# Patient Record
Sex: Female | Born: 1985 | Race: White | Hispanic: No | Marital: Single | Smoking: Current every day smoker
Health system: Southern US, Community
[De-identification: ages and names within clinical notes are randomized; demographics above are authoritative.]

## PROBLEM LIST (undated history)

## (undated) DIAGNOSIS — N390 Urinary tract infection, site not specified: Secondary | ICD-10-CM

## (undated) DIAGNOSIS — R51 Headache: Secondary | ICD-10-CM

## (undated) DIAGNOSIS — N879 Dysplasia of cervix uteri, unspecified: Secondary | ICD-10-CM

## (undated) DIAGNOSIS — F4310 Post-traumatic stress disorder, unspecified: Secondary | ICD-10-CM

## (undated) DIAGNOSIS — R002 Palpitations: Secondary | ICD-10-CM

## (undated) DIAGNOSIS — T50901A Poisoning by unspecified drugs, medicaments and biological substances, accidental (unintentional), initial encounter: Secondary | ICD-10-CM

## (undated) DIAGNOSIS — R519 Headache, unspecified: Secondary | ICD-10-CM

## (undated) DIAGNOSIS — I10 Essential (primary) hypertension: Secondary | ICD-10-CM

## (undated) DIAGNOSIS — J40 Bronchitis, not specified as acute or chronic: Secondary | ICD-10-CM

## (undated) DIAGNOSIS — M419 Scoliosis, unspecified: Secondary | ICD-10-CM

## (undated) DIAGNOSIS — K219 Gastro-esophageal reflux disease without esophagitis: Secondary | ICD-10-CM

## (undated) DIAGNOSIS — F419 Anxiety disorder, unspecified: Secondary | ICD-10-CM

## (undated) DIAGNOSIS — F410 Panic disorder [episodic paroxysmal anxiety] without agoraphobia: Secondary | ICD-10-CM

## (undated) DIAGNOSIS — F1910 Other psychoactive substance abuse, uncomplicated: Secondary | ICD-10-CM

## (undated) DIAGNOSIS — J449 Chronic obstructive pulmonary disease, unspecified: Secondary | ICD-10-CM

## (undated) DIAGNOSIS — A749 Chlamydial infection, unspecified: Secondary | ICD-10-CM

## (undated) DIAGNOSIS — M1990 Unspecified osteoarthritis, unspecified site: Secondary | ICD-10-CM

## (undated) DIAGNOSIS — R87629 Unspecified abnormal cytological findings in specimens from vagina: Secondary | ICD-10-CM

## (undated) HISTORY — DX: Scoliosis, unspecified: M41.9

## (undated) HISTORY — PX: TUBAL LIGATION: SHX77

## (undated) HISTORY — DX: Essential (primary) hypertension: I10

## (undated) HISTORY — DX: Gastro-esophageal reflux disease without esophagitis: K21.9

## (undated) HISTORY — DX: Dysplasia of cervix uteri, unspecified: N87.9

## (undated) HISTORY — PX: MULTIPLE TOOTH EXTRACTIONS: SHX2053

## (undated) HISTORY — PX: COLPOSCOPY: SHX161

## (undated) HISTORY — DX: Anxiety disorder, unspecified: F41.9

## (undated) HISTORY — DX: Chronic obstructive pulmonary disease, unspecified: J44.9

## (undated) HISTORY — DX: Chlamydial infection, unspecified: A74.9

## (undated) HISTORY — DX: Post-traumatic stress disorder, unspecified: F43.10

## (undated) HISTORY — DX: Unspecified abnormal cytological findings in specimens from vagina: R87.629

---
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
## 2007-03-18 DIAGNOSIS — A749 Chlamydial infection, unspecified: Secondary | ICD-10-CM

## 2007-03-18 HISTORY — DX: Chlamydial infection, unspecified: A74.9

## 2008-08-11 ENCOUNTER — Emergency Department: Payer: Self-pay | Admitting: Unknown Physician Specialty

## 2008-08-11 ENCOUNTER — Emergency Department: Payer: Self-pay | Admitting: Emergency Medicine

## 2009-03-22 ENCOUNTER — Emergency Department (HOSPITAL_COMMUNITY): Admission: EM | Admit: 2009-03-22 | Discharge: 2009-03-22 | Payer: Self-pay | Admitting: Emergency Medicine

## 2009-03-24 ENCOUNTER — Emergency Department (HOSPITAL_COMMUNITY): Admission: EM | Admit: 2009-03-24 | Discharge: 2009-03-24 | Payer: Self-pay | Admitting: Emergency Medicine

## 2009-05-11 ENCOUNTER — Emergency Department (HOSPITAL_COMMUNITY): Admission: EM | Admit: 2009-05-11 | Discharge: 2009-05-12 | Payer: Self-pay | Admitting: Emergency Medicine

## 2009-06-27 ENCOUNTER — Emergency Department (HOSPITAL_COMMUNITY): Admission: EM | Admit: 2009-06-27 | Discharge: 2009-06-27 | Payer: Self-pay | Admitting: Emergency Medicine

## 2009-07-16 ENCOUNTER — Emergency Department (HOSPITAL_COMMUNITY): Admission: EM | Admit: 2009-07-16 | Discharge: 2009-07-16 | Payer: Self-pay | Admitting: Emergency Medicine

## 2010-03-17 DIAGNOSIS — F419 Anxiety disorder, unspecified: Secondary | ICD-10-CM

## 2010-03-17 HISTORY — DX: Anxiety disorder, unspecified: F41.9

## 2010-06-02 LAB — CULTURE, BLOOD (ROUTINE X 2)

## 2010-06-02 LAB — WOUND CULTURE

## 2010-06-02 LAB — DIFFERENTIAL
Basophils Relative: 1 % (ref 0–1)
Eosinophils Relative: 1 % (ref 0–5)
Monocytes Absolute: 0.6 10*3/uL (ref 0.1–1.0)
Neutro Abs: 8.2 10*3/uL — ABNORMAL HIGH (ref 1.7–7.7)

## 2010-06-02 LAB — BASIC METABOLIC PANEL
CO2: 25 mEq/L (ref 19–32)
Creatinine, Ser: 0.57 mg/dL (ref 0.4–1.2)
GFR calc Af Amer: >60 mL/min (ref >60)
GFR calc non Af Amer: >60 mL/min (ref >60)

## 2010-06-02 LAB — CBC
Hemoglobin: 11.8 g/dL — ABNORMAL LOW (ref 12.0–15.0)
MCHC: 34.3 g/dL (ref 30.0–36.0)
MCV: 87.1 fL (ref 78.0–100.0)
Platelets: 307 10*3/uL (ref 150–400)
WBC: 10.2 10*3/uL (ref 4.0–10.5)

## 2010-06-04 LAB — DIFFERENTIAL
Basophils Absolute: 0 10*3/uL (ref 0.0–0.1)
Basophils Relative: 1 % (ref 0–1)
Eosinophils Absolute: 0.1 10*3/uL (ref 0.0–0.7)
Eosinophils Relative: 1 % (ref 0–5)

## 2010-06-04 LAB — D-DIMER, QUANTITATIVE: D-Dimer, Quant: 2.99 ug/mL-FEU — ABNORMAL HIGH (ref 0.00–0.48)

## 2010-06-04 LAB — COMPREHENSIVE METABOLIC PANEL
AST: 19 U/L (ref 0–37)
Albumin: 2.9 g/dL — ABNORMAL LOW (ref 3.5–5.2)
BUN: 9 mg/dL (ref 6–23)
CO2: 25 mEq/L (ref 19–32)
GFR calc Af Amer: >60 mL/min (ref >60)
GFR calc non Af Amer: >60 mL/min (ref >60)

## 2010-06-04 LAB — CBC
HCT: 30.5 % — ABNORMAL LOW (ref 36.0–46.0)
Hemoglobin: 11 g/dL — ABNORMAL LOW (ref 12.0–15.0)
MCHC: 36.1 g/dL — ABNORMAL HIGH (ref 30.0–36.0)
MCV: 82.5 fL (ref 78.0–100.0)
Platelets: 219 10*3/uL (ref 150–400)
RBC: 3.69 MIL/uL — ABNORMAL LOW (ref 3.87–5.11)

## 2010-06-06 LAB — DIFFERENTIAL
Lymphs Abs: 1.5 10*3/uL (ref 0.7–4.0)
Neutro Abs: 5.7 10*3/uL (ref 1.7–7.7)

## 2010-06-06 LAB — BASIC METABOLIC PANEL
BUN: 9 mg/dL (ref 6–23)
Calcium: 9.3 mg/dL (ref 8.4–10.5)
Creatinine, Ser: 0.63 mg/dL (ref 0.4–1.2)
GFR calc Af Amer: >60 mL/min (ref >60)
Glucose, Bld: 83 mg/dL (ref 70–99)
Potassium: 3.3 mEq/L — ABNORMAL LOW (ref 3.5–5.1)

## 2010-06-06 LAB — CBC
HCT: 37.4 % (ref 36.0–46.0)
MCHC: 31.5 g/dL (ref 30.0–36.0)
MCV: 83.4 fL (ref 78.0–100.0)
RBC: 4.48 MIL/uL (ref 3.87–5.11)
RDW: 14.9 % (ref 11.5–15.5)

## 2010-08-02 NOTE — Discharge Summary (Signed)
[facility]  Patient:    Rita Baldwin, Rita Baldwin                    MRN: [id number] Adm. Date:  [id number] Disc. Date: [id number] Attending:  Milford Cage H                           Discharge Summary  PATIENT IDENTIFICATION:  Patient was a 25 year old female.  INITIAL ASSESSMENT AND DIAGNOSIS:  Patient was admitted to the hospital after taking approximately 18-20 Excedrin migraine tablets.  She said she had been having problems with her relationship with her "former" boyfriend.  They had broken up recently but they have broken up many times before.  This time, he was dating another girl that neither he nor she liked originally.  She said she was feeling upset, angry and depressed and she had taken the overdose.  By the time of the interview here, she said she realized that she had done wrong. She no longer felt like killing herself.  She had a lot to live for.  She is still depressed and angry over the relationship.  MENTAL STATUS:  At the time of the initial evaluation, revealed an alert, oriented young woman.  She was very straightforward.  She agreed that she did things she was not supposed to do but she was clear about what she did and she was clear about the consequences before she did them and she said she was willing to take her consequences.  She admitted to suicidal thoughts yesterday and the overdose but today she said she felt better.  Insight was minimal. Intellectual functioning seemed at least average.  Concentration was adequate. Judgment currently seemed adequate.  Short and long-term memory were intact. Other pertinent history can be obtained from the psychosocial service summary.  PHYSICAL EXAMINATION:  Noncontributory.  ADMITTING DIAGNOSES: Axis I:    Depressive disorder not otherwise specified. Axis II:   No diagnosis. Axis III:  Healthy. Axis IV:   Moderate. Axis V:    45/75.  FINDINGS:  All indicated laboratory examinations were  within normal limits or noncontributory.  HOSPITAL COURSE:  While in the hospital, patient remained fairly headstrong. She talked openly about her relationship with her boyfriend.  She realized that he was not good for her.  She put up with behaviors that she should not have to put up with.  Nevertheless, she loved him and said she did it for those reasons, otherwise, she was the kind of person who would not normally put up with people being mean or rude to her.  She believed she had finally had enough of her boyfriend but she was also very clear that her life was her choice and the decision she made were hers to make.  People did have to agree or disagree or disagree with them.  She said she learned from her mistakes and she was not asking for any breaks or consequences.  In meeting with her parents, she was equally clear.  To some extent, she was a bit more rude to them but her point was basically the same.  They were willing to be flexible with her but, at the same time, said they were going to enforce their rules as well as they could.  They were concerned that she would be home by herself but they will have somebody checking on her.  Nonetheless, she had consistently denied any threats to kill herself  or anyone else and was subsequently discharged home.  POST-HOSPITAL CARE PLANS:  She is referred to Leeanne Mannan for an appointment on 08/22/99.  DISCHARGE MEDICATIONS:  There were no medications used in the hospital nor were any prescribed at the time of discharge.  FINAL DIAGNOSES: Axis I:    Depressive disorder not otherwise specified. Axis II:   No diagnosis. Axis III:  Healthy. Axis IV:   Moderate. Axis V:    50. DD:  08/25/99 TD:  08/25/99 Job: [id number]

## 2010-08-02 NOTE — Consult Note (Signed)
[facility]  Patient:    Rita Baldwin, Rita Baldwin Visit Number: [id number] MRN: [id number]          Service Type: OUT Location: RAD Attending Physician:  Rita Baldwin Dictated by:   Rita Baldwin, P.A. Admit Date:  06/07/2001 Discharge Date: 06/07/2001   CC:         Rita Baldwin, M.D.   Consultation Report  DATE OF BIRTH:  [birth year]  REFERRING PHYSICIAN:  Kari Baldwin, M.D.  REASON FOR REFERRAL:  Nausea and vomiting.  HISTORY OF PRESENT ILLNESS:  Patient is a pleasant 25 year old Caucasian female who presents today at the request of Dr. Juanetta Baldwin for further evaluation of nausea and vomiting.  She is accompanied by her mother today.  Patient complains of a two month history of intermittent nausea and vomiting which occurs mostly postprandially.  She generally will become nauseated about an hour after she eats and then will proceed with vomiting about two to three hours later.  Symptoms are sporadic and intermittent in nature.  She will go several days in a row with having episodes and then may be okay for a week at a time.  She describes nocturnal symptoms, often up waking up in the middle of the night with nausea and then vomiting.  She complains of typical heartburn symptoms, especially related to certain foods.  She takes Tums three to four days per week for this.  She often has "sour burps."  When she vomits she generally vomits a greenish-yellowish or even brown liquid.  Occasionally, she will vomit up undigested food.  She denies any hoarseness.  She complains of sore throat after vomiting.  She complains of her throat burning.  In addition to postprandial vomiting she also notices episodes of vomiting when she gets very upset.  She has not been on any prescription strength antacids.  She takes Phenergan for her symptoms.  She denies any abdominal pain, constipation, diarrhea, melena, or rectal bleeding.  She takes a  significant amount of ibuprofen, at least taking some from five to seven days per week. She takes this primarily for headaches.  She takes Vioxx occasionally for abdominal pain cramps related to her menses.  She has been on this for at least a couple of years.  She tells me that she has been on birth control pills for three years.  She was switched to Nortrel six to seven months ago; however, had been on this medication several months before the nausea and vomiting began.  She gives a history of intentional overdose on Excedrin when she was 25 years old.  She went through inpatient behavioral medicine program following this episode.  CURRENT MEDICATIONS: 1. Birth control pill q.d. 2. Vioxx p.r.n. 3. Phenergan 25 mg p.r.n. 4. Ibuprofen five to seven days per week.  ALLERGIES:  ACETAMINOPHEN.  PAST MEDICAL HISTORY:  History of overdose on Excedrin age 82 status post inpatient behavioral medicine program.  PAST SURGICAL HISTORY:  Teeth extraction x6.  FAMILY HISTORY:  Mother has hypertension, depression, history of acute hepatitis C which is resolved (at age 49).  Father has history of gastroesophageal reflux disease.  He has a history of acute hepatitis B which resolved at age 42.  Denies any family history of colon cancer.  SOCIAL HISTORY:  She is a Medical sales representative.  She apparently has missed a significant amount of school as she has asked to postpone any further tests until after she finishes this year.  She is a nonsmoker.  She  consumes alcohol one to two times per year.  REVIEW OF SYSTEMS:  Please see HPI for GI.  GENERAL:  Denies any weight loss. GENITOURINARY:  She has a period about three to four times a year which she takes Vioxx for cramps.  She is on birth control pills because of acne. Please see HPI for GI.  PHYSICAL EXAMINATION  VITAL SIGNS:  Weight 121.5, height 5 feet 2.5 inches, temperature 98.6, blood pressure 100/70, pulse 86.  GENERAL:  Very pleasant,  well-nourished, well-developed, young Caucasian female in no acute distress.  She is accompanied by her mother.  SKIN:  Warm and dry.  No jaundice.  HEENT:  Conjunctivae are pink.  Sclerae nonicteric.  Pupils are equal, round, and reactive to light.  Oropharyngeal mucosa moist and pink.  No lesions, erythema, or exudate.  NECK:  No lymphadenopathy, thyromegaly.  CHEST:  Lungs clear to auscultation.  CARDIAC:  Regular rate and rhythm.  Normal S1 and S2.  No murmur, rub, or gallop.  ABDOMEN:  Positive bowel sounds, soft, nondistended.  No organomegaly or masses.  She has mild to moderate tenderness in the right upper quadrant region with deep palpation.  RECTAL:  Deferred at patients request.  EXTREMITIES:  No cyanosis or edema.  LABORATORIES:  Abdominal ultrasound on May 28, 2001 was unremarkable. Gallbladder was normal.  No intrahepatic duct dilatation.  Liver appeared normal.  HIDA scan on June 07, 2001 revealed ejection fraction of 47.7% and did not reproduce the patients nausea and vomiting, although she did have some minimal cramping with the challenge.  IMPRESSION:  Rita Baldwin is a 25 year old Caucasian female with a two month history of intermittent nausea and vomiting.  Symptoms occur mostly postprandially; however, she does note some episodes related to her being upset.  She also has typical acid reflux symptoms which is partially responsive to over-the-counter antacids.  She has no change in her bowel habits which would make her symptoms less likely to be secondary to irritable bowel syndrome.  A functional component of even psychiatric component cannot be ruled out.  However, given history of significant NSAID use, typical acid reflux symptoms, I feel the patient needs to have an EGD in the near future to rule out peptic ulcer disease, changes of reflux esophagitis, or even structural abnormality causing partial outlet obstruction.  Unfortunately,  patient cannot  miss a full day of school because of previous absentees.  She therefore asked to hold off on EGD until the end of May.  I have offered upper GI series to rule out significant ulcer; however, patient and her mother would like to proceed with upper endoscopy only.  Given this we will collect three Hemoccult cards to rule out significant GI bleed and will begin PPI therapy.  We need to also consider inflammatory bowel disease as a possible etiology. At this time she does not appear to have symptoms secondary to gallbladder disease with normal ultrasound and HIDA scan.  PLAN: 1. Hemoccult cards x3. 2. Will begin Nexium 40 mg p.o. q.a.m.  Four weeks of samples given. 3. Information sheet on antireflux measures given and discussed with patient. 4. I have asked the patient to avoid NSAIDs if possible.  She, unfortunately,    is allergic to acetaminophen.  I have asked her to follow up with Dr.    Juanetta Baldwin for possibility of beginning a medication to prevent migraines    instead of patient taking significant NSAIDs to treat migraines.  She will    plan to  make an appointment. 5. EGD in the near future.  Please note patient opted to hold off until the    end of May until when she gets out of school.  I have asked her to let us    know if she develops more persistent nausea, vomiting, abdominal pain,    melena, or rectal bleeding.  I would like to thank Dr. Juanetta Baldwin for allowing [place] to take part in the care of this patient. Dictated by:   Rita Baldwin, P.A. Attending Physician:  Rita Baldwin DD:  07/26/01 TD:  07/27/01 Job: [id number]
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
## 2011-08-23 DIAGNOSIS — F17200 Nicotine dependence, unspecified, uncomplicated: Secondary | ICD-10-CM | POA: Insufficient documentation

## 2011-08-23 DIAGNOSIS — F1120 Opioid dependence, uncomplicated: Secondary | ICD-10-CM | POA: Insufficient documentation

## 2011-08-23 DIAGNOSIS — F19939 Other psychoactive substance use, unspecified with withdrawal, unspecified: Secondary | ICD-10-CM | POA: Insufficient documentation

## 2011-08-23 NOTE — ED Notes (Signed)
Patient states she takes 135mg  methadone per day and clinic "would not give any medication today because they did not have the right dose." Patient complaining of anxiety, chills, sweats, and abdominal cramps.

## 2011-08-24 ENCOUNTER — Encounter (HOSPITAL_COMMUNITY): Payer: Self-pay | Admitting: Emergency Medicine

## 2011-08-24 ENCOUNTER — Emergency Department (HOSPITAL_COMMUNITY)
Admission: EM | Admit: 2011-08-24 | Discharge: 2011-08-24 | Disposition: A | Discharge status: Home / Self Care | Payer: Self-pay | Attending: Emergency Medicine | Admitting: Emergency Medicine

## 2011-08-24 DIAGNOSIS — F1120 Opioid dependence, uncomplicated: Secondary | ICD-10-CM

## 2011-08-24 HISTORY — DX: Other psychoactive substance abuse, uncomplicated: F19.10

## 2011-08-24 MED ORDER — METHADONE HCL 10 MG PO TABS
120.0000 mg | ORAL_TABLET | Freq: Once | ORAL | Status: AC
  Administered 2011-08-24: 120 mg via ORAL
  Filled 2011-08-24: qty 2
  Filled 2011-08-24: qty 10

## 2011-08-24 NOTE — ED Notes (Signed)
Anxiety and cold sweats are predominant complant.  Has begun to have abdominal cramps and nausea along with restless legs for the past hour

## 2011-08-24 NOTE — Discharge Instructions (Signed)
followup with the methadone clinic as scheduled on Monday. Return to the emergency department as needed.

## 2011-08-24 NOTE — ED Provider Notes (Signed)
History     CSN: [id number]  Arrival date & time 08/23/11  2353   First MD Initiated Contact with Patient 08/24/11 0016      Chief Complaint  Patient presents with  . Withdrawal    (Consider location/radiation/quality/duration/timing/severity/associated sxs/prior treatment) The history is provided by the patient.  patient is a 26 year old female recently relocated here from [place] patient is a methadone clinic patient normally gets on 35 mg of methadone a day I went to the methadone clinic here he did have her paperwork but the dosing was in the lining the could not provided. Patient has followup with the clinic on Monday to see the doctor and get her dosing corrected. Patient's going through some mild withdrawal symptoms which include abdominal cramping and anxiety chills and sweats but based on vital signs no acute abnormalities. No significant tachycardia no fever blood pressure 142/84.  Past Medical History  Diagnosis Date  . Drug abuse     History reviewed. No pertinent past surgical history.  History reviewed. No pertinent family history.  History  Substance Use Topics  . Smoking status: Current Everyday Smoker -- 1.0 packs/day  . Smokeless tobacco: Not on file  . Alcohol Use: No    OB History    Grav Para Term Preterm Abortions TAB SAB Ect Mult Living                  Review of Systems  Constitutional: Positive for chills and diaphoresis. Negative for fever.  HENT: Negative for neck pain.   Eyes: Negative for visual disturbance.  Respiratory: Negative for shortness of breath.   Cardiovascular: Negative for chest pain.  Gastrointestinal: Positive for abdominal pain. Negative for nausea, vomiting and diarrhea.  Genitourinary: Negative for dysuria.  Musculoskeletal: Negative for back pain.  Skin: Negative for rash.  Neurological: Negative for headaches.  Psychiatric/Behavioral: The patient is nervous/anxious.     Allergies  Review of patient's allergies  indicates no known allergies.  Home Medications   Current Outpatient Rx  Name Route Sig Dispense Refill  . METHADONE HCL 10 MG/ML PO CONC Oral Take 135 mg by mouth daily.      BP 142/84  Pulse 94  Temp(Src) 98.2 F (36.8 C) (Oral)  Resp 14  Ht 5\' 2"  (1.575 m)  Wt 100 lb (45.36 kg)  BMI 18.29 kg/m2  SpO2 100%  LMP 02/23/2011  Physical Exam  Nursing note and vitals reviewed. Constitutional: She is oriented to person, place, and time. She appears well-developed and well-nourished. No distress.  HENT:  Head: Atraumatic.  Mouth/Throat: Oropharynx is clear and moist.  Eyes: Conjunctivae and EOM are normal. Pupils are equal, round, and reactive to light.  Neck: Normal range of motion. Neck supple.  Cardiovascular: Normal rate, regular rhythm and normal heart sounds.   No murmur heard. Pulmonary/Chest: Effort normal and breath sounds normal.  Abdominal: Soft. Bowel sounds are normal. There is no tenderness.  Musculoskeletal: Normal range of motion. She exhibits no edema and no tenderness.  Neurological: She is alert and oriented to person, place, and time. No cranial nerve deficit. She exhibits normal muscle tone. Coordination normal.  Skin: Skin is warm. No rash noted. She is not diaphoretic.    ED Course  Procedures (including critical care time)  Labs Reviewed - No data to display No results found.   1. Narcotic addiction       MDM  Patient recently moved to the area is a methadone clinic patient does have paperwork to verify  it only gets on135 mg of methadone each day clinic here though had a mismatch on the paperwork patient does have her paperwork and they could not prescribe her dose today she has followup with the adult clinic doctor on Monday to get this corrected. Patient normally takes 135 mg of methadone daily,only able to prescribe R. 20 mg orally here. Patient not going through any acute withdrawal symptoms however she is a little agitated and having some  abdominal cramps. In no acute distress.        Shelda Jakes, MD 08/24/11 [phone]

## 2011-08-24 NOTE — ED Notes (Signed)
Assumed care for discharge and medication administration only.  Discharge instructions given and reviewed with patient.  Patient verbalized understanding to follow up with Methadone clinic on Monday and to return to ER as needed.  Patient ambulatory; discharged home in good condition.

## 2012-04-18 ENCOUNTER — Emergency Department (HOSPITAL_COMMUNITY): Payer: Self-pay

## 2012-04-18 ENCOUNTER — Emergency Department (HOSPITAL_COMMUNITY)
Admission: EM | Admit: 2012-04-18 | Discharge: 2012-04-18 | Disposition: A | Discharge status: Home / Self Care | Payer: Self-pay | Attending: Emergency Medicine | Admitting: Emergency Medicine

## 2012-04-18 ENCOUNTER — Encounter (HOSPITAL_COMMUNITY): Payer: Self-pay

## 2012-04-18 DIAGNOSIS — F1910 Other psychoactive substance abuse, uncomplicated: Secondary | ICD-10-CM

## 2012-04-18 DIAGNOSIS — M25549 Pain in joints of unspecified hand: Secondary | ICD-10-CM | POA: Insufficient documentation

## 2012-04-18 DIAGNOSIS — Y9389 Activity, other specified: Secondary | ICD-10-CM | POA: Insufficient documentation

## 2012-04-18 DIAGNOSIS — F17200 Nicotine dependence, unspecified, uncomplicated: Secondary | ICD-10-CM | POA: Insufficient documentation

## 2012-04-18 DIAGNOSIS — F1410 Cocaine abuse, uncomplicated: Secondary | ICD-10-CM | POA: Insufficient documentation

## 2012-04-18 DIAGNOSIS — W460XXA Contact with hypodermic needle, initial encounter: Secondary | ICD-10-CM | POA: Insufficient documentation

## 2012-04-18 DIAGNOSIS — M7989 Other specified soft tissue disorders: Secondary | ICD-10-CM | POA: Insufficient documentation

## 2012-04-18 DIAGNOSIS — IMO0002 Reserved for concepts with insufficient information to code with codable children: Secondary | ICD-10-CM | POA: Insufficient documentation

## 2012-04-18 DIAGNOSIS — Y929 Unspecified place or not applicable: Secondary | ICD-10-CM | POA: Insufficient documentation

## 2012-04-18 LAB — CBC WITH DIFFERENTIAL/PLATELET
Basophils Absolute: 0 10*3/uL (ref 0.0–0.1)
Eosinophils Absolute: 0.5 10*3/uL (ref 0.0–0.7)
Eosinophils Relative: 11 % — ABNORMAL HIGH (ref 0–5)
HCT: 36.7 % (ref 36.0–46.0)
Hemoglobin: 12.2 g/dL (ref 12.0–15.0)
MCH: 29.4 pg (ref 26.0–34.0)
MCHC: 33.2 g/dL (ref 30.0–36.0)
MCV: 88.4 fL (ref 78.0–100.0)
Neutro Abs: 2.7 10*3/uL (ref 1.7–7.7)
Platelets: 288 10*3/uL (ref 150–400)

## 2012-04-18 LAB — BASIC METABOLIC PANEL
CO2: 26 mEq/L (ref 19–32)
GFR calc Af Amer: >90 mL/min (ref >90)
GFR calc non Af Amer: >90 mL/min (ref >90)
Glucose, Bld: 79 mg/dL (ref 70–99)
Potassium: 3.8 mEq/L (ref 3.5–5.1)
Sodium: 137 mEq/L (ref 135–145)

## 2012-04-18 MED ORDER — VANCOMYCIN HCL IN DEXTROSE 1-5 GM/200ML-% IV SOLN
1000.0000 mg | Freq: Once | INTRAVENOUS | Status: DC
  Filled 2012-04-18: qty 200

## 2012-04-18 MED ORDER — CLINDAMYCIN HCL 300 MG PO CAPS: 300.0000 mg | ORAL_CAPSULE | Freq: Three times a day (TID) | ORAL | Status: AC

## 2012-04-18 MED ORDER — CLINDAMYCIN PHOSPHATE 600 MG/4ML IJ SOLN
600.0000 mg | Freq: Once | INTRAMUSCULAR | Status: AC
  Administered 2012-04-18: 600 mg via INTRAMUSCULAR
  Filled 2012-04-18: qty 4
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
## 2012-04-18 NOTE — ED Notes (Signed)
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 Noted today that her hands are swollen where she injected. Also c/o dizziness. Last use was @ 5am 1/31.

## 2012-04-18 NOTE — ED Notes (Signed)
Patient with no complaints at this time. Respirations even and unlabored. Skin warm/dry. Discharge instructions reviewed with patient at this time. Patient given opportunity to voice concerns/ask questions. Patient discharged at this time and left Emergency Department with steady gait.   

## 2012-04-23 LAB — CULTURE, BLOOD (ROUTINE X 2)
Culture: NO GROWTH
Culture: NO GROWTH

## 2012-08-28 ENCOUNTER — Encounter (HOSPITAL_COMMUNITY): Payer: Self-pay

## 2012-08-28 ENCOUNTER — Emergency Department (HOSPITAL_COMMUNITY)
Admission: EM | Admit: 2012-08-28 | Discharge: 2012-08-28 | Disposition: A | Discharge status: Home / Self Care | Payer: Self-pay | Attending: Emergency Medicine | Admitting: Emergency Medicine

## 2012-08-28 DIAGNOSIS — Y92410 Unspecified street and highway as the place of occurrence of the external cause: Secondary | ICD-10-CM | POA: Insufficient documentation

## 2012-08-28 DIAGNOSIS — Y9389 Activity, other specified: Secondary | ICD-10-CM | POA: Insufficient documentation

## 2012-08-28 DIAGNOSIS — F17200 Nicotine dependence, unspecified, uncomplicated: Secondary | ICD-10-CM | POA: Insufficient documentation

## 2012-08-28 DIAGNOSIS — Z792 Long term (current) use of antibiotics: Secondary | ICD-10-CM | POA: Insufficient documentation

## 2012-08-28 DIAGNOSIS — F1910 Other psychoactive substance abuse, uncomplicated: Secondary | ICD-10-CM | POA: Insufficient documentation

## 2012-08-28 DIAGNOSIS — IMO0002 Reserved for concepts with insufficient information to code with codable children: Secondary | ICD-10-CM | POA: Insufficient documentation

## 2012-08-28 DIAGNOSIS — Z79899 Other long term (current) drug therapy: Secondary | ICD-10-CM | POA: Insufficient documentation

## 2012-08-28 DIAGNOSIS — M62830 Muscle spasm of back: Secondary | ICD-10-CM
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
## 2012-09-05 ENCOUNTER — Emergency Department (HOSPITAL_COMMUNITY)
Admission: EM | Admit: 2012-09-05 | Discharge: 2012-09-06 | Disposition: A | Discharge status: Other Acute Inpatient Hospital | Payer: Self-pay | Attending: Emergency Medicine | Admitting: Emergency Medicine

## 2012-09-05 ENCOUNTER — Encounter (HOSPITAL_COMMUNITY): Payer: Self-pay | Admitting: *Deleted

## 2012-09-05 DIAGNOSIS — F17200 Nicotine dependence, unspecified, uncomplicated: Secondary | ICD-10-CM | POA: Insufficient documentation

## 2012-09-05 DIAGNOSIS — F22 Delusional disorders: Secondary | ICD-10-CM | POA: Insufficient documentation

## 2012-09-05 DIAGNOSIS — R45851 Suicidal ideations: Secondary | ICD-10-CM | POA: Insufficient documentation

## 2012-09-05 DIAGNOSIS — R443 Hallucinations, unspecified: Secondary | ICD-10-CM | POA: Insufficient documentation

## 2012-09-05 DIAGNOSIS — Z79899 Other long term (current) drug therapy: Secondary | ICD-10-CM | POA: Insufficient documentation

## 2012-09-05 DIAGNOSIS — Z87828 Personal history of other (healed) physical injury and trauma: Secondary | ICD-10-CM | POA: Insufficient documentation

## 2012-09-05 DIAGNOSIS — F1910 Other psychoactive substance abuse, uncomplicated: Secondary | ICD-10-CM | POA: Insufficient documentation

## 2012-09-05 DIAGNOSIS — Z3202 Encounter for pregnancy test, result negative: Secondary | ICD-10-CM | POA: Insufficient documentation

## 2012-09-05 DIAGNOSIS — F411 Generalized anxiety disorder: Secondary | ICD-10-CM | POA: Insufficient documentation

## 2012-09-05 HISTORY — DX: Poisoning by unspecified drugs, medicaments and biological substances, accidental (unintentional), initial encounter: T50.901A

## 2012-09-05 LAB — CBC WITH DIFFERENTIAL/PLATELET
Hemoglobin: 11.9 g/dL — ABNORMAL LOW (ref 12.0–15.0)
Lymphocytes Relative: 19 % (ref 12–46)
Lymphs Abs: 1.3 10*3/uL (ref 0.7–4.0)
MCH: 29.6 pg (ref 26.0–34.0)
Monocytes Relative: 4 % (ref 3–12)
Neutrophils Relative %: 74 % (ref 43–77)
Platelets: 269 10*3/uL (ref 150–400)
RBC: 4.02 MIL/uL (ref 3.87–5.11)
WBC: 6.9 10*3/uL (ref 4.0–10.5)

## 2012-09-05 LAB — RAPID URINE DRUG SCREEN, HOSP PERFORMED
Amphetamines: NOT DETECTED
Benzodiazepines: POSITIVE — AB
Cocaine: NOT DETECTED
Opiates: NOT DETECTED
Tetrahydrocannabinol: NOT DETECTED

## 2012-09-05 LAB — URINALYSIS, ROUTINE W REFLEX MICROSCOPIC
Bilirubin Urine: NEGATIVE
Glucose, UA: NEGATIVE mg/dL
Hgb urine dipstick: NEGATIVE
Specific Gravity, Urine: 1.025 (ref 1.005–1.030)
Urobilinogen, UA: 0.2 mg/dL (ref 0.0–1.0)
pH: 6 (ref 5.0–8.0)

## 2012-09-05 LAB — BASIC METABOLIC PANEL
BUN: 18 mg/dL (ref 6–23)
CO2: 25 mEq/L (ref 19–32)
Chloride: 105 mEq/L (ref 96–112)
Glucose, Bld: 101 mg/dL — ABNORMAL HIGH (ref 70–99)
Potassium: 4 mEq/L (ref 3.5–5.1)
Sodium: 140 mEq/L (ref 135–145)

## 2012-09-05 MED ORDER — NAPROXEN SODIUM 220 MG PO TABS: 220.0000 mg | ORAL_TABLET | Freq: Two times a day (BID) | ORAL | Status: DC

## 2012-09-05 MED ORDER — CHLORDIAZEPOXIDE HCL 25 MG PO CAPS
25.0000 mg | ORAL_CAPSULE | Freq: Four times a day (QID) | ORAL | Status: DC | PRN
  Administered 2012-09-05 – 2012-09-06 (×4): 25 mg via ORAL
  Filled 2012-09-05 (×3): qty 1

## 2012-09-05 MED ORDER — NAPROXEN 250 MG PO TABS
250.0000 mg | ORAL_TABLET | Freq: Two times a day (BID) | ORAL | Status: DC
  Administered 2012-09-05 – 2012-09-06 (×2): 250 mg via ORAL
  Filled 2012-09-05 (×2): qty 1

## 2012-09-05 MED ORDER — NICOTINE 21 MG/24HR TD PT24
21.0000 mg | MEDICATED_PATCH | Freq: Once | TRANSDERMAL | Status: AC
  Administered 2012-09-05: 21 mg via TRANSDERMAL
  Filled 2012-09-05 (×2): qty 1

## 2012-09-05 MED ORDER — LOPERAMIDE HCL 2 MG PO CAPS: 2.0000 mg | ORAL_CAPSULE | ORAL | Status: DC | PRN

## 2012-09-05 MED ORDER — METHOCARBAMOL 500 MG PO TABS
500.0000 mg | ORAL_TABLET | Freq: Three times a day (TID) | ORAL | Status: DC | PRN
  Administered 2012-09-06 (×2): 500 mg via ORAL
  Filled 2012-09-05 (×2): qty 1

## 2012-09-05 MED ORDER — LEVONORGEST-ETH ESTRAD 91-DAY 0.15-0.03 MG PO TABS: 1.0000 | ORAL_TABLET | Freq: Every day | ORAL | Status: DC

## 2012-09-05 MED ORDER — NAPROXEN 250 MG PO TABS
500.0000 mg | ORAL_TABLET | Freq: Two times a day (BID) | ORAL | Status: DC | PRN
  Administered 2012-09-06: 500 mg via ORAL
  Filled 2012-09-05: qty 2

## 2012-09-05 MED ORDER — CLONIDINE HCL 0.1 MG PO TABS
0.1000 mg | ORAL_TABLET | Freq: Every day | ORAL | Status: DC
  Administered 2012-09-06: 0.1 mg via ORAL

## 2012-09-05 MED ORDER — ONDANSETRON 4 MG PO TBDP: 4.0000 mg | ORAL_TABLET | Freq: Four times a day (QID) | ORAL | Status: DC | PRN

## 2012-09-05 MED ORDER — CLONIDINE HCL 0.1 MG PO TABS
0.1000 mg | ORAL_TABLET | Freq: Four times a day (QID) | ORAL | Status: DC
  Administered 2012-09-05 – 2012-09-06 (×4): 0.1 mg via ORAL
  Filled 2012-09-05 (×7): qty 1

## 2012-09-05 MED ORDER — DICYCLOMINE HCL 10 MG PO CAPS: 20.0000 mg | ORAL_CAPSULE | Freq: Four times a day (QID) | ORAL | Status: DC | PRN

## 2012-09-05 MED ORDER — CLONIDINE HCL 0.1 MG PO TABS: 0.1000 mg | ORAL_TABLET | Freq: Three times a day (TID) | ORAL | Status: DC | PRN

## 2012-09-05 MED ORDER — ZOLPIDEM TARTRATE 5 MG PO TABS
5.0000 mg | ORAL_TABLET | Freq: Every evening | ORAL | Status: DC | PRN
  Administered 2012-09-05: 5 mg via ORAL
  Filled 2012-09-05 (×2): qty 1

## 2012-09-05 MED ORDER — CLONIDINE HCL 0.1 MG PO TABS: 0.1000 mg | ORAL_TABLET | ORAL | Status: DC

## 2012-09-05 MED ORDER — HYDROXYZINE HCL 25 MG PO TABS: 25.0000 mg | ORAL_TABLET | Freq: Four times a day (QID) | ORAL | Status: DC | PRN

## 2012-09-05 MED ORDER — BENZTROPINE MESYLATE 1 MG PO TABS
1.0000 mg | ORAL_TABLET | Freq: Two times a day (BID) | ORAL | Status: DC
  Administered 2012-09-05 – 2012-09-06 (×3): 1 mg via ORAL
  Filled 2012-09-05 (×3): qty 1

## 2012-09-05 NOTE — ED Notes (Signed)
Pt mother Marquita Palms [phone].

## 2012-09-05 NOTE — ED Notes (Signed)
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 Is currently taking 5mg  methadone a day for detox, pt c/o being paranoid, states that she feels like someone is out to get her, frame her for stuff she had done in the past. Pt states "I am done with this shit, I have enough medication and know what I need to take to end it all". Pt tearful at triage, denies any visual or auditory hallucinations, states that someone is missing with her stuff even though people denies any tampering, states "I know that I am not imaging these things". Admits to SI, denies any HI.
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
## 2012-09-05 NOTE — ED Provider Notes (Signed)
Medical screening examination/treatment/procedure(s) were conducted as a shared visit with non-physician practitioner(s) and myself.  I personally evaluated the patient during the encounter  IVC. Concern for safety. Rita Baldwin with [facility] to evaluate  Lyanne Co, MD 09/05/12 [phone]
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
## 2012-09-05 NOTE — ED Notes (Addendum)
Held [place] secondary to very sleepy state, pt awake enough to present an argument for holding her cogentin and giving her a clonidine. Explained that I could not give her the clonidine now secondary to low BP. Pt agreed to take the cogentin. Also states she is hungry but needs a soft diet because of wisdom teeth extractions 3 weeks ago. Will get her a diet if she is still awake in 15 min.

## 2012-09-05 NOTE — ED Notes (Addendum)
Pt brought in home medication-birth control Introvale, 23 pills, round, peach, imprint J4/SZ (verified with Garrison Columbus RN) placed with pt belongings. Pt states she has already taken today's dose.
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
## 2012-09-05 NOTE — ED Notes (Signed)
Crackers/juice given per request.

## 2012-09-05 NOTE — ED Notes (Signed)
Hattie Perch with ACT at bedside.  Meal tray given.  nad noted.  Sitter at bedside.

## 2012-09-05 NOTE — ED Notes (Addendum)
Pt sound asleep. Need to shake to wake.

## 2012-09-06 ENCOUNTER — Encounter (HOSPITAL_COMMUNITY): Payer: Self-pay | Admitting: *Deleted

## 2012-09-06 ENCOUNTER — Inpatient Hospital Stay (HOSPITAL_COMMUNITY)
Admission: AD | Admit: 2012-09-06 | Discharge: 2012-09-12 | DRG: 885 | Disposition: A | Discharge status: Home / Self Care | Payer: Federal, State, Local not specified - Other | Source: Intra-hospital | Attending: Psychiatry | Admitting: Psychiatry

## 2012-09-06 DIAGNOSIS — F19950 Other psychoactive substance use, unspecified with psychoactive substance-induced psychotic disorder with delusions: Secondary | ICD-10-CM

## 2012-09-06 DIAGNOSIS — F1120 Opioid dependence, uncomplicated: Secondary | ICD-10-CM

## 2012-09-06 DIAGNOSIS — F1920 Other psychoactive substance dependence, uncomplicated: Secondary | ICD-10-CM | POA: Diagnosis present

## 2012-09-06 DIAGNOSIS — Z79899 Other long term (current) drug therapy: Secondary | ICD-10-CM

## 2012-09-06 DIAGNOSIS — R45851 Suicidal ideations: Secondary | ICD-10-CM

## 2012-09-06 DIAGNOSIS — F1320 Sedative, hypnotic or anxiolytic dependence, uncomplicated: Secondary | ICD-10-CM | POA: Diagnosis present

## 2012-09-06 DIAGNOSIS — F332 Major depressive disorder, recurrent severe without psychotic features: Secondary | ICD-10-CM

## 2012-09-06 DIAGNOSIS — F1994 Other psychoactive substance use, unspecified with psychoactive substance-induced mood disorder: Secondary | ICD-10-CM

## 2012-09-06 DIAGNOSIS — F333 Major depressive disorder, recurrent, severe with psychotic symptoms: Principal | ICD-10-CM | POA: Diagnosis present

## 2012-09-06 MED ORDER — ZIPRASIDONE HCL 20 MG PO CAPS
20.0000 mg | ORAL_CAPSULE | Freq: Two times a day (BID) | ORAL | Status: DC
  Administered 2012-09-06: 20 mg via ORAL
  Filled 2012-09-06 (×5): qty 1

## 2012-09-06 MED ORDER — NAPROXEN 500 MG PO TABS
250.0000 mg | ORAL_TABLET | Freq: Every day | ORAL | Status: DC | PRN
  Administered 2012-09-06: 250 mg via ORAL
  Filled 2012-09-06: qty 1

## 2012-09-06 MED ORDER — CHLORDIAZEPOXIDE HCL 25 MG PO CAPS
25.0000 mg | ORAL_CAPSULE | Freq: Four times a day (QID) | ORAL | Status: DC | PRN
  Administered 2012-09-06 – 2012-09-07 (×4): 25 mg via ORAL
  Filled 2012-09-06 (×4): qty 1

## 2012-09-06 MED ORDER — BENZTROPINE MESYLATE 1 MG PO TABS
1.0000 mg | ORAL_TABLET | Freq: Every day | ORAL | Status: DC
  Administered 2012-09-06 – 2012-09-11 (×6): 1 mg via ORAL
  Filled 2012-09-06 (×10): qty 1

## 2012-09-06 MED ORDER — ACETAMINOPHEN 325 MG PO TABS: 650.0000 mg | ORAL_TABLET | Freq: Four times a day (QID) | ORAL | Status: DC | PRN

## 2012-09-06 MED ORDER — ASENAPINE MALEATE 5 MG SL SUBL
5.0000 mg | SUBLINGUAL_TABLET | Freq: Every day | SUBLINGUAL | Status: DC
  Administered 2012-09-06 – 2012-09-11 (×6): 5 mg via SUBLINGUAL
  Filled 2012-09-06 (×11): qty 1

## 2012-09-06 MED ORDER — ALUM & MAG HYDROXIDE-SIMETH 200-200-20 MG/5ML PO SUSP: 30.0000 mL | ORAL | Status: DC | PRN

## 2012-09-06 MED ORDER — NICOTINE 14 MG/24HR TD PT24
14.0000 mg | MEDICATED_PATCH | Freq: Every day | TRANSDERMAL | Status: DC
  Administered 2012-09-07: 14 mg via TRANSDERMAL
  Filled 2012-09-06 (×2): qty 1

## 2012-09-06 MED ORDER — MAGNESIUM HYDROXIDE 400 MG/5ML PO SUSP: 30.0000 mL | Freq: Every day | ORAL | Status: DC | PRN

## 2012-09-06 MED ORDER — NICOTINE 21 MG/24HR TD PT24
21.0000 mg | MEDICATED_PATCH | Freq: Every day | TRANSDERMAL | Status: DC
  Administered 2012-09-06: 21 mg via TRANSDERMAL
  Filled 2012-09-06: qty 1

## 2012-09-06 MED ORDER — CHLORDIAZEPOXIDE HCL 25 MG PO CAPS
ORAL_CAPSULE | ORAL | Status: AC
  Filled 2012-09-06: qty 1

## 2012-09-06 MED ORDER — LEVONORGEST-ETH ESTRAD 91-DAY 0.15-0.03 MG PO TABS
1.0000 | ORAL_TABLET | Freq: Every day | ORAL | Status: DC
  Administered 2012-09-07 – 2012-09-12 (×6): 1 via ORAL

## 2012-09-06 MED ORDER — CLONIDINE HCL 0.1 MG PO TABS
0.1000 mg | ORAL_TABLET | Freq: Three times a day (TID) | ORAL | Status: DC | PRN
  Administered 2012-09-06 – 2012-09-08 (×5): 0.1 mg via ORAL
  Filled 2012-09-06: qty 1

## 2012-09-06 NOTE — Progress Notes (Signed)
09/06/2012 2:08 PM Patient is accepted to [facility] when bed is available. Rona Ravens. Alexandre Faries RPAC 2:09 PM 09/06/2012

## 2012-09-06 NOTE — ED Notes (Signed)
To be placed  [facility]. Colon Branch, MD 09/06/12 0003
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
## 2012-09-06 NOTE — ED Provider Notes (Signed)
Pt is IVC accepted at [facility]; Pt sleeping, wakes easily to voice, states Geodon helping paranoia. 1600  Hurman Horn, MD 09/06/12 [id number]

## 2012-09-06 NOTE — Tx Team (Signed)
Initial Interdisciplinary Treatment Plan  PATIENT STRENGTHS: (choose at least two) Ability for insight Average or above average intelligence Communication skills General fund of knowledge Motivation for treatment/growth Supportive family/friends  PATIENT STRESSORS: Financial difficulties Loss of  relationship with BF Substance abuse   PROBLEM LIST: Problem List/Patient Goals Date to be addressed Date deferred Reason deferred Estimated date of resolution  [facility] 09/06/12     Substance Abuse 09/06/12     Suicidal Ideations 09/06/12                                          DISCHARGE CRITERIA:  Adequate post-discharge living arrangements Improved stabilization in mood, thinking, and/or behavior Motivation to continue treatment in a less acute level of care Need for constant or close observation no longer present Reduction of life-threatening or endangering symptoms to within safe limits Verbal commitment to aftercare and medication compliance Withdrawal symptoms are absent or subacute and managed without 24-hour nursing intervention  PRELIMINARY DISCHARGE PLAN: Attend aftercare/continuing care group Attend PHP/IOP Attend 12-step recovery group Outpatient therapy Participate in family therapy Return to previous living arrangement  PATIENT/FAMIILY INVOLVEMENT: This treatment plan has been presented to and reviewed with the patient, Rita Baldwin, and/or family member.  The patient and family have been given the opportunity to ask questions and make suggestions.  Mickeal Needy 09/06/2012, 10:34 PM

## 2012-09-06 NOTE — Progress Notes (Signed)
Per [facility] Eric K, pt now has a bed  Room 404-1 admitted to Dr Jannifer Franklin.  [employer]. Pt's nurse and informed her. Pt will transported by [facility].

## 2012-09-07 DIAGNOSIS — F333 Major depressive disorder, recurrent, severe with psychotic symptoms: Principal | ICD-10-CM

## 2012-09-07 MED ORDER — BOOST / RESOURCE BREEZE PO LIQD
1.0000 | Freq: Three times a day (TID) | ORAL | Status: DC
  Administered 2012-09-07: 1 via ORAL
  Filled 2012-09-07 (×22): qty 1

## 2012-09-07 MED ORDER — ADULT MULTIVITAMIN W/MINERALS CH
1.0000 | ORAL_TABLET | Freq: Every day | ORAL | Status: DC
  Administered 2012-09-07 – 2012-09-08 (×2): 1 via ORAL
  Filled 2012-09-07 (×3): qty 1

## 2012-09-07 MED ORDER — NICOTINE 21 MG/24HR TD PT24
21.0000 mg | MEDICATED_PATCH | Freq: Every day | TRANSDERMAL | Status: DC
  Administered 2012-09-07 – 2012-09-12 (×6): 21 mg via TRANSDERMAL
  Filled 2012-09-07 (×9): qty 1

## 2012-09-07 NOTE — [facility] Group Notes (Signed)
[facility] LCSW Group Therapy  09/07/2012  1:15 PM   Type of Therapy:  Group Therapy  Participation Level:  Attentive  Participation Quality:  Appropriate and Alert  Affect:  Appropriate, Flat  Cognitive:  Alert but slow to speak and process  Insight:  Developing/Improving and Engaged  Engagement in Therapy:  Developing/Improving and Engaged  Modes of Intervention:  Activity, Clarification, Confrontation, Discussion, Education, Exploration, Limit-setting, Orientation, Problem-solving, Rapport Building, Dance movement psychotherapist, Socialization and Support  Summary of Progress/Problems: The topic for group therapy was feelings about diagnosis.  Pt actively participated in group discussion on their past and current diagnosis and how they feel towards this.  Pt also identified how society and family members judge them, based on their diagnosis as well as stereotypes and stigmas.   Pt shared that she has been diagnosed with substance abuse and depression.  Pt shared that she was relieved to find out she was depressed and not bipolar, as her mom suspected.  Pt went into great detail about her substance abuse history and recovery.  Pt states that she is aware of her triggers and symptoms.  Pt was able to process feelings of embarrassment with her substance abuse.  Pt was engaged in group discussion and interacted well with peers.    Reyes Ivan, LCSWA 09/07/2012 2:54 PM

## 2012-09-07 NOTE — Progress Notes (Signed)
NUTRITION ASSESSMENT  Pt identified as at risk on the Malnutrition Screen Tool  INTERVENTION: 1. Educated patient on the importance of nutrition and encouraged intake of food and beverages. 2. Discussed weight goals. 3. Supplements: Resource Breeze po TID, each supplement provides 250 kcal and 9 grams of protein and MVI daily.   NUTRITION DIAGNOSIS: Unintentional weight loss related to sub-optimal intake as evidenced by pt report.   Goal: Pt to meet >/= 90% of their estimated nutrition needs.  Monitor:  PO intake  Assessment:  Patient with a hx of drug abuse admitted with paranoia.  Reports being "so hungry" but not eating well.  Patient did not stay on topic well.  Recent teeth extraction 5/30. UBW of 125 with weight loss of 16 lbs in the last 6 weeks per patient and an 11 lb weight loss in the past 9 days per chart.    27 y.o. female  Height: Ht Readings from Last 1 Encounters:  09/06/12 5' (1.524 m)    Weight: Wt Readings from Last 1 Encounters:  09/06/12 109 lb (49.442 kg)    Weight Hx: Wt Readings from Last 10 Encounters:  09/06/12 109 lb (49.442 kg)  08/28/12 120 lb (54.432 kg)  04/18/12 100 lb (45.36 kg)  08/24/11 100 lb (45.36 kg)    BMI:  Body mass index is 21.29 kg/(m^2). Pt meets criteria for wnl based on current BMI.  Estimated Nutritional Needs: Kcal: 25-30 kcal/kg Protein: > 1 gram protein/kg Fluid: 1 ml/kcal  Diet Order: General Pt is also offered choice of unit snacks mid-morning and mid-afternoon.  Pt is eating as desired.   Lab results and medications reviewed.   Oran Rein, RD, LDN Clinical Inpatient Dietitian Pager:  [phone] Weekend and after hours pager:  [phone]

## 2012-09-07 NOTE — Tx Team (Signed)
Interdisciplinary Treatment Plan Update (Adult)  Date: 09/07/2012  Time Reviewed:  9:45 AM  Progress in Treatment: Attending groups: Yes Participating in groups:  Yes Taking medication as prescribed:  Yes Tolerating medication:  Yes Family/Significant othe contact made: CSW assessing  Patient understands diagnosis:  Yes Discussing patient identified problems/goals with staff:  Yes Medical problems stabilized or resolved:  Yes Denies suicidal/homicidal ideation: Yes Issues/concerns per patient self-inventory:  Yes Other:  New problem(s) identified: N/A  Discharge Plan or Barriers: CSW assessing for appropriate referrals.  Reason for Continuation of Hospitalization: Anxiety Depression Medication Stabilization Suicidal Ideation  Comments: N/A  Estimated length of stay: 3-5 days  For review of initial/current patient goals, please see plan of care.  Attendees: Patient:  Rita Baldwin  09/07/2012 10:10 AM   Family:     Physician:  Dr. Thedore Mins 09/07/2012 10:10 AM   Nursing:   Joslyn Devon, RN 09/07/2012 10:10 AM   Clinical Social Worker:  Reyes Ivan, LCSWA 09/07/2012 10:10 AM   Other: Liborio Nixon, RN 09/07/2012 10:10 AM   Other:  Verne Spurr, PA 09/07/2012 10:10 AM  Other:     Other:     Other:    Other:    Other:    Other:    Other:    Other:     Scribe for Treatment Team:   Carmina Miller, 09/07/2012 10:10 AM
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
## 2012-09-07 NOTE — [facility] Group Notes (Signed)
[facility] LCSW Aftercare Discharge Planning Group Note   09/07/2012 8:45 AM  Participation Quality:  Did Not Attend  Reyes Ivan, LCSWA 09/07/2012 9:38 AM
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
## 2012-09-08 DIAGNOSIS — F19939 Other psychoactive substance use, unspecified with withdrawal, unspecified: Secondary | ICD-10-CM

## 2012-09-08 DIAGNOSIS — F200 Paranoid schizophrenia: Secondary | ICD-10-CM

## 2012-09-08 MED ORDER — ADULT MULTIVITAMIN W/MINERALS CH
1.0000 | ORAL_TABLET | Freq: Every day | ORAL | Status: DC
  Administered 2012-09-08 – 2012-09-12 (×5): 1 via ORAL
  Filled 2012-09-08 (×7): qty 1

## 2012-09-08 MED ORDER — CHLORDIAZEPOXIDE HCL 25 MG PO CAPS: 25.0000 mg | ORAL_CAPSULE | Freq: Every day | ORAL | Status: DC

## 2012-09-08 MED ORDER — NAPROXEN 500 MG PO TABS
500.0000 mg | ORAL_TABLET | Freq: Two times a day (BID) | ORAL | Status: DC | PRN
  Administered 2012-09-09 – 2012-09-11 (×4): 500 mg via ORAL
  Filled 2012-09-08 (×4): qty 1

## 2012-09-08 MED ORDER — CHLORDIAZEPOXIDE HCL 25 MG PO CAPS
25.0000 mg | ORAL_CAPSULE | Freq: Two times a day (BID) | ORAL | Status: DC
  Administered 2012-09-09: 25 mg via ORAL
  Filled 2012-09-08: qty 1

## 2012-09-08 MED ORDER — ONDANSETRON 4 MG PO TBDP: 4.0000 mg | ORAL_TABLET | Freq: Four times a day (QID) | ORAL | Status: AC | PRN

## 2012-09-08 MED ORDER — METHOCARBAMOL 500 MG PO TABS
500.0000 mg | ORAL_TABLET | Freq: Three times a day (TID) | ORAL | Status: DC | PRN
  Administered 2012-09-08 – 2012-09-11 (×7): 500 mg via ORAL
  Filled 2012-09-08 (×9): qty 1

## 2012-09-08 MED ORDER — CHLORDIAZEPOXIDE HCL 25 MG PO CAPS: 25.0000 mg | ORAL_CAPSULE | ORAL | Status: DC

## 2012-09-08 MED ORDER — DIVALPROEX SODIUM ER 250 MG PO TB24
250.0000 mg | ORAL_TABLET | Freq: Two times a day (BID) | ORAL | Status: DC
  Administered 2012-09-08 – 2012-09-09 (×3): 250 mg via ORAL
  Filled 2012-09-08 (×8): qty 1

## 2012-09-08 MED ORDER — DICYCLOMINE HCL 20 MG PO TABS: 20.0000 mg | ORAL_TABLET | Freq: Four times a day (QID) | ORAL | Status: DC | PRN

## 2012-09-08 MED ORDER — VITAMIN B-1 100 MG PO TABS
100.0000 mg | ORAL_TABLET | Freq: Every day | ORAL | Status: DC
  Administered 2012-09-09 – 2012-09-12 (×4): 100 mg via ORAL
  Filled 2012-09-08 (×6): qty 1

## 2012-09-08 MED ORDER — CLONIDINE HCL 0.1 MG PO TABS
0.1000 mg | ORAL_TABLET | Freq: Every day | ORAL | Status: AC
  Administered 2012-09-09: 0.1 mg via ORAL
  Filled 2012-09-08: qty 1

## 2012-09-08 MED ORDER — THIAMINE HCL 100 MG/ML IJ SOLN: 100.0000 mg | Freq: Once | INTRAMUSCULAR | Status: DC

## 2012-09-08 MED ORDER — CLONIDINE HCL 0.1 MG PO TABS
0.1000 mg | ORAL_TABLET | Freq: Two times a day (BID) | ORAL | Status: AC
  Administered 2012-09-08 (×2): 0.1 mg via ORAL
  Filled 2012-09-08 (×3): qty 1

## 2012-09-08 MED ORDER — CHLORDIAZEPOXIDE HCL 25 MG PO CAPS: 25.0000 mg | ORAL_CAPSULE | Freq: Four times a day (QID) | ORAL | Status: DC

## 2012-09-08 MED ORDER — CHLORDIAZEPOXIDE HCL 25 MG PO CAPS
25.0000 mg | ORAL_CAPSULE | Freq: Three times a day (TID) | ORAL | Status: AC
  Administered 2012-09-08 (×2): 25 mg via ORAL
  Filled 2012-09-08 (×2): qty 1

## 2012-09-08 MED ORDER — CLONIDINE HCL 0.1 MG PO TABS: 0.1000 mg | ORAL_TABLET | Freq: Four times a day (QID) | ORAL | Status: DC

## 2012-09-08 MED ORDER — CHLORDIAZEPOXIDE HCL 25 MG PO CAPS: 25.0000 mg | ORAL_CAPSULE | Freq: Four times a day (QID) | ORAL | Status: DC | PRN

## 2012-09-08 MED ORDER — CLONIDINE HCL 0.1 MG PO TABS: 0.1000 mg | ORAL_TABLET | Freq: Every day | ORAL | Status: DC

## 2012-09-08 MED ORDER — LOPERAMIDE HCL 2 MG PO CAPS: 2.0000 mg | ORAL_CAPSULE | ORAL | Status: DC | PRN

## 2012-09-08 MED ORDER — ONDANSETRON 4 MG PO TBDP: 4.0000 mg | ORAL_TABLET | Freq: Four times a day (QID) | ORAL | Status: DC | PRN

## 2012-09-08 MED ORDER — CLONIDINE HCL 0.1 MG PO TABS: 0.1000 mg | ORAL_TABLET | ORAL | Status: DC

## 2012-09-08 MED ORDER — HYDROXYZINE HCL 25 MG PO TABS: 25.0000 mg | ORAL_TABLET | Freq: Four times a day (QID) | ORAL | Status: DC | PRN

## 2012-09-08 MED ORDER — CHLORDIAZEPOXIDE HCL 25 MG PO CAPS: 25.0000 mg | ORAL_CAPSULE | Freq: Three times a day (TID) | ORAL | Status: DC

## 2012-09-08 NOTE — Progress Notes (Signed)
Patient ID: Rita Baldwin, female   DOB: Nov 27, 1985, 27 y.o.   MRN: [id number]   D:  Pt is passive SI, but contracts for safety.Pt denies HI/AVH. Pt very argumentative, Tangential, and demanding. Pt is attention-seeking and exhibiting drug seeking behaviors. Pt continues to request her clonidine even though pt Bp low. Pt continues to be agitated and frustrated about every situation brought to pt.   A: Pt was offered support and encouragement. Pt was given scheduled medications. Pt was encourage to attend groups. Q 15 minute checks were done for safety. Pt encouraged to drink Gatorade and water.     R: Pt is taking medication.Pt not receptive to treatment, but safety maintained on unit.
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
## 2012-09-08 NOTE — Progress Notes (Signed)
Adult Psychoeducational Group Note  Date:  09/08/2012 Time:  3:03 PM  Group Topic/Focus: Communication  Healthy Communication:   The focus of this group is to discuss communication, barriers to communication, as well as healthy ways to communicate with others.  Participation Level:  Active  Participation Quality:  Appropriate  Affect:  Appropriate  Cognitive:  Alert  Insight: Appropriate  Engagement in Group:  Supportive  Modes of Intervention:  Exploration  Additional Comments:  Patient was very active in group. She shared different experiences with the group and offered support to other patients who also participated.   Delia Chimes 09/08/2012, 3:03 PM

## 2012-09-08 NOTE — Progress Notes (Signed)
Patient ID: Rita Baldwin, female   DOB: Aug 18, 1985, 27 y.o.   MRN: [id number] D: pt. Lying in bed, but up to group. A: Writer reviews meds with client. Staff will monitor q37min for safety. Staff encouraged group. R: Pt. Is safe on the unit. Pt. Attends group.

## 2012-09-08 NOTE — [facility] Group Notes (Signed)
[facility] LCSW Group Therapy  09/08/2012  1:15 PM   Type of Therapy:  Group Therapy  Participation Level:  Active  Participation Quality:  Appropriate and Attentive  Affect:  Appropriate  Cognitive:  Alert and Appropriate  Insight:  Developing/Improving and Engaged  Engagement in Therapy:  Developing/Improving and Engaged  Modes of Intervention:  Activity, Clarification, Confrontation, Discussion, Education, Exploration, Limit-setting, Orientation, Problem-solving, Rapport Building, Dance movement psychotherapist, Socialization and Support  Summary of Progress/Problems: Patient was attentive and engaged with speaker from [employer].  Speaker shared his story of dealing with mental health and played guitar to engage pt.  Pt was able to relate to speaker in regards to diagnosis and mental health.  Pt was attentive to the speaker and asked appropriate questions about his story and diagnosis.    Tyaire Odem Horton, LCSWA 09/08/2012 1:16 PM

## 2012-09-08 NOTE — Progress Notes (Signed)
The focus of this group is to help patients review their daily goal of treatment and discuss progress on daily workbooks. Pt attended the evening group session and responded to all discussion prompts from the Writer. Pt reported the highlight of her day related to learning she had received financial assistance regarding a hospital bill. Pt expressed frustration over the medications she has been prescribed here and with several staff members whom she did not name. Pt stated her long term goal was to return to school and learn Spanish. Pt was very intrusive throughout group, often talking over her peers when it was their turn to speak and requiring continuous redirection in order to allow others the opportunity to speak. Pt's affect was neutral.

## 2012-09-08 NOTE — [facility] Suicide Risk Assessment (Signed)
[facility] INPATIENT:  Family/Significant Other Suicide Prevention Education  Suicide Prevention Education:  Education Completed; Chuck Hint - mother [phone]),  (name of family member/significant other) has been identified by the patient as the family member/significant other with whom the patient will be residing, and identified as the person(s) who will aid the patient in the event of a mental health crisis (suicidal ideations/suicide attempt).  With written consent from the patient, the family member/significant other has been provided the following suicide prevention education, prior to the and/or following the discharge of the patient.  The suicide prevention education provided includes the following:  Suicide risk factors  Suicide prevention and interventions  National Suicide Hotline telephone number  [facility] assessment telephone number   [facility] Emergency Assistance 911  [facility] and/or Residential Mobile Crisis Unit telephone number  Request made of family/significant other to:  Remove weapons (e.g., guns, rifles, knives), all items previously/currently identified as safety concern.    Remove drugs/medications (over-the-counter, prescriptions, illicit drugs), all items previously/currently identified as a safety concern.  The family member/significant other verbalizes understanding of the suicide prevention education information provided.  The family member/significant other agrees to remove the items of safety concern listed above. Mrs. Bistyga states that she believes pt has bipolar disorder and wants staff to assess for pt's diagnosis.  Mrs. Bistyga states that her main concern for pt's symptoms are her paranoia.    Rita Baldwin 09/08/2012, 11:20 AM

## 2012-09-08 NOTE — Progress Notes (Signed)
[facility] MD Progress Note  09/08/2012 10:37 AM Rita Baldwin  MRN:  [id number] Subjective: "I didn't sleep, you stopped all my medicines!" Objective: Patient is angry that she did not get all of her regular medications. She notes that she is having "serious withdrawal" but does not appear to be in any distress. She is more alert, and her speech is spontaneous. She is irritated but calm and cooperative. Appears to be overselling her symptoms, her last COWS score was an 8 this AM and her vitals re stable. Diagnosis:  Paranoid schizophrenia, opiate withdrawal, benzo-withdrawal.  ADL's:  Intact  Sleep: Poor  Appetite:  Fair  Suicidal Ideation:  denies Homicidal Ideation:  denies AEB (as evidenced by): patient continues to be paranoid, feels that everyone is plotting against her.  Psychiatric Specialty Exam: Review of Systems  Constitutional: Positive for chills, malaise/fatigue and diaphoresis.  HENT: Negative.   Eyes: Negative.   Respiratory: Negative.   Cardiovascular: Negative.   Gastrointestinal: Positive for nausea.  Musculoskeletal: Positive for back pain.  Neurological: Negative.   Endo/Heme/Allergies: Negative.   Psychiatric/Behavioral: Positive for depression and substance abuse. The patient is nervous/anxious and has insomnia.     Blood pressure 102/69, pulse 103, temperature 98.5 F (36.9 C), temperature source Oral, resp. rate 20, height 5' (1.524 m), weight 49.442 kg (109 lb), last menstrual period 07/07/2012.Body mass index is 21.29 kg/(m^2).  General Appearance: Disheveled and Guarded  Patent attorney::  Fair  Speech:  Clear and Coherent  Volume:  Increased  Mood:  Irritable  Affect:  Labile  Thought Process:  Goal Directed  Orientation:  NA  Thought Content:  Paranoid Ideation  Suicidal Thoughts:  No  Homicidal Thoughts:  No  Memory:  NA  Judgement:  Impaired  Insight:  Lacking  Psychomotor Activity:  Restlessness  Concentration:  Poor  Recall:  Poor   Akathisia:  No  Handed:  Right  AIMS (if indicated):     Assets:  Physical Health  Sleep:  Number of Hours: 4   Current Medications: Current Facility-Administered Medications  Medication Dose Route Frequency Provider Last Rate Last Dose  . acetaminophen (TYLENOL) tablet 650 mg  650 mg Oral Q6H PRN Kerry Hough, PA-C      . alum & mag hydroxide-simeth (MAALOX/MYLANTA) 200-200-20 MG/5ML suspension 30 mL  30 mL Oral Q4H PRN Kerry Hough, PA-C      . asenapine (SAPHRIS) sublingual tablet 5 mg  5 mg Sublingual QHS Kerry Hough, PA-C   5 mg at 09/07/12 2304  . benztropine (COGENTIN) tablet 1 mg  1 mg Oral QHS Kerry Hough, PA-C   1 mg at 09/07/12 2304  . chlordiazePOXIDE (LIBRIUM) capsule 25 mg  25 mg Oral TID Verne Spurr, PA-C       Followed by  . [START ON 09/09/2012] chlordiazePOXIDE (LIBRIUM) capsule 25 mg  25 mg Oral BID Verne Spurr, PA-C      . cloNIDine (CATAPRES) tablet 0.1 mg  0.1 mg Oral BID Verne Spurr, PA-C      . Melene Muller ON 09/09/2012] cloNIDine (CATAPRES) tablet 0.1 mg  0.1 mg Oral Daily Verne Spurr, PA-C      . dicyclomine (BENTYL) tablet 20 mg  20 mg Oral Q6H PRN Verne Spurr, PA-C      . feeding supplement (RESOURCE BREEZE) liquid 1 Container  1 Container Oral TID BM Jeoffrey Massed, RD   1 Container at 09/07/12 1258  . levonorgestrel-ethinyl estradiol (SEASONALE,INTROVALE,JOLESSA) 0.15-0.03 MG per tablet 1 tablet  1 tablet Oral Daily Kerry Hough, PA-C   1 tablet at 09/08/12 1478  . magnesium hydroxide (MILK OF MAGNESIA) suspension 30 mL  30 mL Oral Daily PRN Kerry Hough, PA-C      . methocarbamol (ROBAXIN) tablet 500 mg  500 mg Oral Q8H PRN Verne Spurr, PA-C      . multivitamin with minerals tablet 1 tablet  1 tablet Oral Daily Verne Spurr, PA-C      . naproxen (NAPROSYN) tablet 500 mg  500 mg Oral BID PRN Verne Spurr, PA-C      . nicotine (NICODERM CQ - dosed in mg/24 hours) patch 21 mg  21 mg Transdermal Q0600 Kerry Hough, PA-C   21 mg at  09/08/12 0815  . ondansetron (ZOFRAN-ODT) disintegrating tablet 4 mg  4 mg Oral Q6H PRN Verne Spurr, PA-C      . Melene Muller ON 09/09/2012] thiamine (VITAMIN B-1) tablet 100 mg  100 mg Oral Daily Verne Spurr, PA-C        Lab Results: No results found for this or any previous visit (from the past 48 hour(s)).  Physical Findings: AIMS: Facial and Oral Movements Muscles of Facial Expression: None, normal Lips and Perioral Area: None, normal Jaw: None, normal Tongue: None, normal,Extremity Movements Upper (arms, wrists, hands, fingers): None, normal Lower (legs, knees, ankles, toes): None, normal, Trunk Movements Neck, shoulders, hips: None, normal, Overall Severity Severity of abnormal movements (highest score from questions above): None, normal Incapacitation due to abnormal movements: None, normal Patient's awareness of abnormal movements (rate only patient's report): No Awareness, Dental Status Current problems with teeth and/or dentures?: Yes (five tooth pulled recently) Does patient usually wear dentures?: No  CIWA:  CIWA-Ar Total: 3 COWS:  COWS Total Score: 8  Treatment Plan Summary: Daily contact with patient to assess and evaluate symptoms and progress in treatment Medication management  Plan: 1. Will continue the Clonidine on a regular taper starting with day #2. 2. Will continue the Librium taper on a regular schedule starting with day #2. 3. Will add Depakote for mood stabilization 250mg  po BID. 4. Will continue Saphris 5mg  at hs for paranoid ideation. 5. ELOS: 4-6 days.  Medical Decision Making Problem Points:  Established problem, stable/improving (1) Data Points:  Review or order clinical lab tests (1) Review of medication regiment & side effects (2)  I certify that inpatient services furnished can reasonably be expected to improve the patient's condition.  Rona Ravens. Deangelo Berns RPAC 10:48 AM 09/08/2012

## 2012-09-08 NOTE — Progress Notes (Signed)
Patient ID: Rita Baldwin, female   DOB: [birth year], 27 y.o.   MRN: [id number] Patient is irritable and mood is labile today.  Patient expressed her dismay over being weaned off her anxiety medication.  She stated, "I would have never agreed to come here if I had been aware of that."  "They should have told me that before I was admitted."  Patient has poor insight regarding her treatment.  She does attend group, however, elects to not participate.  She denies any SI/HI/AVH.  Patient given robaxin for muscle spasms.  Her BP continues to be monitored due to low values and she is on clonidine protocol.  Continue to monitor medication management and MD orders.  Safety checks continued every 15 minutes per protocol.  Patient is not receptive to staff; she continues to express her complaints regarding her medication.

## 2012-09-08 NOTE — [facility] Group Notes (Cosign Needed)
[facility] LCSW Aftercare Discharge Planning Group Note   09/08/2012 8:45 AM  Participation Quality:  Alert and Appropriate   Mood/Affect:  Agitated  Depression Rating:  10  Anxiety Rating:  10  Thoughts of Suicide:  Pt denies SI/HI, stating not at this moment  Will you contract for safety?   Yes  Current AVH:  Pt denies AVH  Plan for Discharge/Comments:  Pt attended discharge planning group and actively participated in group.  CSW provided pt with today's workbook.  Pt states that she doesn't want to participate in group and will talk to CSW privately.  Pt states that she doesn't want to say something she shouldn't.  No further needs voiced by pt at this time.     CSW met with pt individually after group. Pt states that she is not comfortable sharing in group anymore but could not elaborate any further as to why.  Pt states that she feels like we are playing the game operation with her head.  Pt states that she felt the doctor did not listen to her yesterday and is now taken off her anxiety medication that she has been on for 5 months.  Pt states that they are not addictive and doesn't know why we would do this.  CSW asked pt if she could utilize other coping skills besides medication and pt states medication is the only thing that helps.  Pt began to escalate so CSW encouraged pt lay down to calm down and CSW would talk with her later.    Transportation Means: Pt's family will pick pt up  Supports: Family is supportive  Reyes Ivan, LCSWA 09/08/2012 9:30 AM

## 2012-09-09 MED ORDER — CHLORDIAZEPOXIDE HCL 25 MG PO CAPS
25.0000 mg | ORAL_CAPSULE | Freq: Every day | ORAL | Status: DC
  Administered 2012-09-10 – 2012-09-12 (×3): 25 mg via ORAL
  Filled 2012-09-09 (×3): qty 1

## 2012-09-09 MED ORDER — DIVALPROEX SODIUM ER 250 MG PO TB24
250.0000 mg | ORAL_TABLET | ORAL | Status: DC
  Administered 2012-09-10 – 2012-09-12 (×3): 250 mg via ORAL
  Filled 2012-09-09 (×6): qty 1

## 2012-09-09 MED ORDER — DIVALPROEX SODIUM ER 500 MG PO TB24
500.0000 mg | ORAL_TABLET | Freq: Every day | ORAL | Status: DC
  Administered 2012-09-09 – 2012-09-11 (×3): 500 mg via ORAL
  Filled 2012-09-09 (×6): qty 1

## 2012-09-09 NOTE — Progress Notes (Signed)
Adult Psychoeducational Group Note  Date:  09/09/2012 Time:  9:43 PM  Group Topic/Focus:  Karaoke  Participation Level:  Minimal  Participation Quality:  Appropriate  Affect:  Appropriate  Cognitive:  Alert  Insight: Appropriate  Engagement in Group:  Engaged  Modes of Intervention:  Socialization  Additional CommentsFlonnie Hailstone 09/09/2012, 9:43 PM

## 2012-09-09 NOTE — [facility] Group Notes (Signed)
[facility] LCSW Group Therapy  09/09/2012  1:15 PM   Type of Therapy:  Group Therapy  Participation Level:  Did Not Attend  Janiece Scovill Horton, LCSWA 09/09/2012 2:35 PM   

## 2012-09-09 NOTE — Progress Notes (Signed)
Patient ID: Rita Baldwin, female   DOB: [birth year], 27 y.o.   MRN: [id number] D: pt. Visible on unit, in dayroom watching TV.  Pt. C/o of pain back. A: Writer administered naproxen  for pain. (see MAR). Staff will monitor q4min for safety. Pt. Encouraged to attend karaoke. R: Pt. Is safe on the unit, refused karaoke at first but then decided to attend. Pt. Questions meds repeatedly, angry that Librium and Clonidine not administered tonight.

## 2012-09-09 NOTE — Progress Notes (Signed)
Patient ID: Rita Baldwin, female   DOB: 1985/06/08, 27 y.o.   MRN: [id number] Patient's mood has improved from yesterday.  She has brighter affect; she is not as argumentative with staff.  She is accepting of the fact that she will be weaned from the librium.  Her CIWA and COWS have been 0-1.  She denies any SI/HI/AVH.  Her main complaint is her back pain which she is receiving robaxin and naproxin for.  Patient is attending groups and participating in her treatment.  Continue to monitor medication management and MD orders.  Safety checks completed every 15 minutes per protocol.  Patient is receptive to staff today; her behavior is appropriate.

## 2012-09-09 NOTE — Progress Notes (Signed)
[facility] MD Progress Note  09/09/2012 2:13 PM Rita Baldwin  MRN:  [id number] Subjective: "I didn't sleep at all last night!" "You shouldn't have changed my medication!" "I have increased anxiety and sweats and chills!"  Objective: Patient is irritable and demanding this morning. She was sleeping prior to visit today. She appears sleepy, drowsy, and is slow to respond, she becomes more demanding and irritable during the length of the visit. She states she did not want to stop the clonidine and Librium. She states she has been on these medications for several months and she does not feel that she is ready to be off of them.     RNs note that she was pleasant and cooperative this morning when getting her medication.   Diagnosis:  Opiate dependence, Benzodiazepine dependence, Substance induced psychosis, substance induced mood disorder  ADL's:  Impaired  Sleep: Poor  Appetite:  Poor  Suicidal Ideation:  denies Homicidal Ideation:  denies AEB (as evidenced by): Patient's report of symptoms, mood, sleep and appetite, and affect.  Psychiatric Specialty Exam: Review of Systems  Constitutional: Positive for diaphoresis.  Gastrointestinal: Positive for nausea.  Neurological: Positive for dizziness and weakness.    Blood pressure 98/62, pulse 92, temperature 97.7 F (36.5 C), temperature source Oral, resp. rate 16, height 5' (1.524 m), weight 49.442 kg (109 lb), last menstrual period 07/07/2012.Body mass index is 21.29 kg/(m^2).  General Appearance: Casual  Eye Contact::  Good  Speech:  Slow  Volume:  Increased  Mood:  Irritable  Affect:  Labile  Thought Process:  Goal Directed  Orientation:  Full (Time, Place, and Person)  Thought Content:  normal  Suicidal Thoughts:  Yes.  without intent/plan  Homicidal Thoughts:  No  Memory:  NA  Judgement:  Impaired  Insight:  Lacking  Psychomotor Activity:  Normal  Concentration:  Poor  Recall:  Poor  Akathisia:  No  Handed:  Right  AIMS  (if indicated):     Assets:  Physical Health  Sleep:  Number of Hours: 3.5   Current Medications: Current Facility-Administered Medications  Medication Dose Route Frequency Provider Last Rate Last Dose  . acetaminophen (TYLENOL) tablet 650 mg  650 mg Oral Q6H PRN Kerry Hough, PA-C      . alum & mag hydroxide-simeth (MAALOX/MYLANTA) 200-200-20 MG/5ML suspension 30 mL  30 mL Oral Q4H PRN Kerry Hough, PA-C      . asenapine (SAPHRIS) sublingual tablet 5 mg  5 mg Sublingual QHS Kerry Hough, PA-C   5 mg at 09/08/12 2146  . benztropine (COGENTIN) tablet 1 mg  1 mg Oral QHS Kerry Hough, PA-C   1 mg at 09/08/12 2146  . [START ON 09/10/2012] chlordiazePOXIDE (LIBRIUM) capsule 25 mg  25 mg Oral Daily Verne Spurr, PA-C      . dicyclomine (BENTYL) tablet 20 mg  20 mg Oral Q6H PRN Verne Spurr, PA-C      . [START ON 09/10/2012] divalproex (DEPAKOTE ER) 24 hr tablet 250 mg  250 mg Oral BH-q7a Verne Spurr, PA-C      . divalproex (DEPAKOTE ER) 24 hr tablet 500 mg  500 mg Oral QHS Verne Spurr, PA-C      . feeding supplement (RESOURCE BREEZE) liquid 1 Container  1 Container Oral TID BM Jeoffrey Massed, RD   1 Container at 09/07/12 1258  . levonorgestrel-ethinyl estradiol (SEASONALE,INTROVALE,JOLESSA) 0.15-0.03 MG per tablet 1 tablet  1 tablet Oral Daily Kerry Hough, PA-C   1 tablet at  09/09/12 0800  . magnesium hydroxide (MILK OF MAGNESIA) suspension 30 mL  30 mL Oral Daily PRN Kerry Hough, PA-C      . methocarbamol (ROBAXIN) tablet 500 mg  500 mg Oral Q8H PRN Verne Spurr, PA-C   500 mg at 09/09/12 0800  . multivitamin with minerals tablet 1 tablet  1 tablet Oral Daily Verne Spurr, PA-C   1 tablet at 09/09/12 0741  . naproxen (NAPROSYN) tablet 500 mg  500 mg Oral BID PRN Verne Spurr, PA-C   500 mg at 09/09/12 0405  . nicotine (NICODERM CQ - dosed in mg/24 hours) patch 21 mg  21 mg Transdermal Q0600 Kerry Hough, PA-C   21 mg at 09/09/12 0654  . ondansetron (ZOFRAN-ODT)  disintegrating tablet 4 mg  4 mg Oral Q6H PRN Verne Spurr, PA-C      . thiamine (VITAMIN B-1) tablet 100 mg  100 mg Oral Daily Verne Spurr, PA-C   100 mg at 09/09/12 4098    Lab Results: No results found for this or any previous visit (from the past 48 hour(s)).  Physical Findings:  Patient reported diaphoresis, dizziness and lightheadedness. She had some weaving on ambulation to conference room, but was just woken up. She had no obvious diaphoresis, but was wearing 3 heavy shirts, flannel pants.      She had no tremors or piloerection.  AIMS: Facial and Oral Movements Muscles of Facial Expression: None, normal Lips and Perioral Area: None, normal Jaw: None, normal Tongue: None, normal,Extremity Movements Upper (arms, wrists, hands, fingers): None, normal Lower (legs, knees, ankles, toes): None, normal, Trunk Movements Neck, shoulders, hips: None, normal, Overall Severity Severity of abnormal movements (highest score from questions above): None, normal Incapacitation due to abnormal movements: None, normal Patient's awareness of abnormal movements (rate only patient's report): No Awareness, Dental Status Current problems with teeth and/or dentures?: Yes Does patient usually wear dentures?: No  CIWA:  CIWA-Ar Total: 1 COWS:  COWS Total Score: 1  Treatment Plan Summary: Daily contact with patient to assess and evaluate symptoms and progress in treatment Medication management  Plan: 1. Will increase her depakote to 250mg  AM, and 500mg  pm for mood stabilization. 2. Continue withdrawal protocols as written. 3. Anticipate D/C upon completion of protocols. 4. ELOS: 3 days. Medical Decision Making Problem Points:  Established problem, stable/improving (1), New problem, with no additional work-up planned (3) and Review of psycho-social stressors (1) Data Points:  Review of medication regiment & side effects (2)  I certify that inpatient services furnished can reasonably be expected to  improve the patient's condition.  Rona Ravens. Aniza Shor RPAC 2:26 PM 09/09/2012

## 2012-09-09 NOTE — [facility] Group Notes (Signed)
[facility] LCSW Aftercare Discharge Planning Group Note   09/09/2012 8:45 AM  Participation Quality:  Alert and Appropriate   Mood/Affect:  Appropriate but flat  Depression Rating:  3  Anxiety Rating:  3  Thoughts of Suicide:  Pt denies SI/HI  Will you contract for safety?   Yes  Current AVH:  Pt denies AVH  Plan for Discharge/Comments:  Pt attended discharge planning group and actively participated in group.  CSW provided pt with today's workbook.  Pt states that she is doing better today then yesterday.  Pt shared that she is proud of herself for what she has overcome, in regards to her drug use and coming off of methadone.  Pt states that she will return home and [facility] up with [facility] for medication management and therapy.  Pt was very talkative in group.  Pt met with CSW after group and informed CSW that she was not comfortable sharing her personal information, such as where she will go when she d/c and how she will get home.  CSW stated that was fine and pt could let CSW know her plans privately.  CSW encouraged pt to only share what she felt comfortable sharing in group.  No further needs voiced by pt at this time.    Transportation Means: Pt's family will pick pt up  Supports: Family is supportive  Reyes Ivan, LCSWA 09/09/2012 9:52 AM

## 2012-09-09 NOTE — Progress Notes (Signed)
Adult Psychoeducational Group Note  Date:  09/09/2012 Time:  10:41 AM  Group Topic/Focus:  Self Esteem Action Plan:   The focus of this group is to help patients create a plan to continue to build self-esteem after discharge.  Participation Level:  Active  Participation Quality:  Appropriate, Attentive, Redirectable and Sharing  Affect:  Appropriate  Cognitive:  Alert and Appropriate  Insight: Good  Engagement in Group:  Engaged  Modes of Intervention:  Activity, Discussion, Socialization and Support  Additional Comments:  Pt came to group and shared and discussed throughout the entire group. Pt did have to be redirected at times due to getting off-topic. This group focused on self-esteem and had an activity for making an action plan to identify obstacles and triggers for building positive self-esteem. The patient discussed and thought about patterns, activities, support and other means for increasing self-esteem and making short and long term goals for self-esteem.    Cathlean Cower 09/09/2012, 10:41 AM

## 2012-09-10 LAB — CBC WITH DIFFERENTIAL/PLATELET
Basophils Relative: 1 % (ref 0–1)
Eosinophils Absolute: 0.3 10*3/uL (ref 0.0–0.7)
Eosinophils Relative: 4 % (ref 0–5)
HCT: 34.8 % — ABNORMAL LOW (ref 36.0–46.0)
Hemoglobin: 12 g/dL (ref 12.0–15.0)
Lymphocytes Relative: 33 % (ref 12–46)
MCH: 29.4 pg (ref 26.0–34.0)
MCHC: 34.5 g/dL (ref 30.0–36.0)
Monocytes Absolute: 0.4 10*3/uL (ref 0.1–1.0)
Neutro Abs: 3.9 10*3/uL (ref 1.7–7.7)
Neutrophils Relative %: 56 % (ref 43–77)
RBC: 4.08 MIL/uL (ref 3.87–5.11)

## 2012-09-10 MED ORDER — DIPHENHYDRAMINE HCL 50 MG PO CAPS
50.0000 mg | ORAL_CAPSULE | Freq: Once | ORAL | Status: AC
  Administered 2012-09-10: 50 mg via ORAL
  Filled 2012-09-10: qty 1

## 2012-09-10 NOTE — Progress Notes (Signed)
Seen and agreed. Cecia Egge, MD 

## 2012-09-10 NOTE — Progress Notes (Signed)
D Pt is seen [facility] on the 400 hall today...tolerated fair. SHe is easily agitated, focusing on what she is NOT getting ( in terms of medications) as opposed to how WELL she is doing. She has minimal insight. She wants " any medicine you will give me".    A She completed her AM self inventory and on it she wrote she denied SI within the previous 24 hrs, she rated her depression and hopelessness " 1/1" and stated her DC plan is to " f/u with counsleing and to find mutual support".   R Safety is in place, therapeutic relationship fostered and PA writes tonight poss DC tomorrow if pt stable and labs ( cbc with diff and TSH) WNL.

## 2012-09-10 NOTE — [facility] Group Notes (Signed)
[facility] LCSW Group Therapy  09/10/2012  1:15 PM   Type of Therapy:  Group Therapy  Participation Level:  Active  Participation Quality:  Appropriate and Attentive  Affect:  Appropriate  Cognitive:  Alert and Appropriate  Insight:  Developing/Improving, Engaged  Engagement in Therapy:  Developing/Improving, Engaged   Modes of Intervention:  Activity, Clarification, Confrontation, Discussion, Education, Exploration, Limit-setting, Orientation, Problem-solving, Rapport Building, Dance movement psychotherapist, Socialization and Support  Summary of Progress/Problems: The topic for today was feelings about relapse.  Pt discussed what relapse prevention is to them and identified triggers that they are on the path to relapse.  Pt processed their feeling towards relapse and was able to relate to peers.  Pt discussed coping skills that can be used for relapse prevention.  Pt shared that she feels she relapsed when she had to move back into her parent's home.  Pt shared feelings of embarrassment and shame of having to do this, but hopeful that she can move forward and be where she wants to be.  Pt also participated in an activity where they pick their emotion off of a chart and process why they are feeling this emotion at this time.  Pt shared that she is feeling annoyed, disappointment and miserable.  Pt was able to process these feelings and shared that she feels she isn't being heard heard here by staff in regards to her medication.  Pt shared that she came here for suicidal ideation, not detox and looks forward to returning to her normal life which consists of routine.  Pt actively participated in group discussion.    Reyes Ivan, LCSWA 09/10/2012 2:09 PM

## 2012-09-10 NOTE — Progress Notes (Signed)
[facility] Adult Case Management Discharge Plan :  Will you be returning to the same living situation after discharge: Yes,  returning home At discharge, do you have transportation home?:Yes,  parents will pick pt up Do you have the ability to pay for your medications:Yes,  access to meds  Release of information consent forms completed and in the chart;  Patient's signature needed at discharge.  Patient to Follow up at: Follow-up Information   Follow up with [facility] On 09/30/2012. (Appointment scheduled at 9:45 am with Dr. Evelene Croon for medication management (CSW confirmed appt with patient but records won't be sent, patient refused))    Contact information:   [address],  [place], [place] [address]  Phone: [phone] Fax: [phone]      Schedule an appointment as soon as possible for a visit with [facility]. (Patient refused to send records, will schedule own appointment for therapy with Onalee Hua)    Contact information:   [address] [address] [place], [place] [address]. Phone: [phone]. Fax: [phone]      Patient denies SI/HI:   Yes,  denies SI/HI    Safety Planning and Suicide Prevention discussed:  Yes,  discussed with pt and pt's mother (see suicide prevention note)  Pt refused to sign consent to send records and states that she would feel more comfortable taking her d/c paperwork to her outpatient providers herself.  CSW did verify pt's follow up appt with Dr. Evelene Croon on speakerphone with pt.    Carmina Miller 09/10/2012, 3:11 PM

## 2012-09-10 NOTE — [facility] Group Notes (Signed)
[facility], [facility] LCSW Aftercare Discharge Planning Group Note   09/10/2012 8:45 AM  Participation Quality:  Alert and Appropriate   Mood/Affect:  Appropriate but flat  Depression Rating:  0  Anxiety Rating:  0  Thoughts of Suicide:  Pt denies SI/HI  Will you contract for safety?   Yes  Current AVH:  Pt denies AVH  Plan for Discharge/Comments:  Pt attended discharge planning group and actively participated in group.  CSW provided pt with today's workbook.Pt states that she participated in karaoke last night and enjoyed it.  Pt states that she will return home and follow up with [facility] for medication management and therapy.  No further needs voiced by pt at this time.    Transportation Means: Pt's family will pick pt up  Supports: Family is supportive  Reyes Ivan, LCSWA 09/10/2012 10:27 AM

## 2012-09-10 NOTE — Progress Notes (Signed)
Patient ID: Rita Baldwin, female   DOB: 02/24/1986, 27 y.o.   MRN: [id number]  D: Patient pleasant on approach tonight. Openly talks about her substance abuse issues and problems she has encountered in the past. Lab had some difficulty drawing blood but was able to get some with patient's permission for extra stick. Patient hoping that everything will be good on labs so she will possibly be discharged this weekend. Patient reports that she is going to be compliant with everything. Denies any SI/HI. A: Staff will monitor on q 15 minute checks, follow treatment plan, and give meds as ordered. R: Taking all meds without difficulty. One time order of benadryl obtained due to patient reports laying there but having some racing thoughts and anxiety about whether or not she will be able to leave tomorrow.

## 2012-09-10 NOTE — Progress Notes (Signed)
Patient ID: Rita Baldwin, female   DOB: 09/20/1985, 27 y.o.   MRN: [id number] [facility] MD Progress Note  09/10/2012 2:46 PM Rita Baldwin  MRN:  [id number] Subjective: "I'm better today!" "I"m ready to go home."  Objective: Patient notes that she is trying hard not to lose her temper, states she is done with Detox and didn't come here for that in the first place!  She notes again that she has a follow up appointment with Dr. Evelene Croon in July.   Diagnosis:  Opiate dependence, Benzodiazepine dependence, Substance induced psychosis, substance induced mood disorder  ADL's:  Impaired  Sleep: fair  Appetite:  All the food here is too spicey!  Suicidal Ideation:  denies Homicidal Ideation:  denies AEB (as evidenced by): Patient's report of symptoms, mood, sleep and appetite, and affect.  Psychiatric Specialty Exam: Review of Systems  Constitutional: Negative for fever, weight loss, malaise/fatigue and diaphoresis. Chills: I'm cold all the time.  HENT: Negative for congestion and sore throat.   Eyes: Negative for blurred vision, double vision and photophobia.  Respiratory: Negative for cough, shortness of breath and wheezing.   Cardiovascular: Negative for chest pain, palpitations and PND.  Gastrointestinal: Negative for heartburn, nausea, vomiting, abdominal pain, diarrhea and constipation.  Musculoskeletal: Negative for myalgias, joint pain and falls.  Neurological: Negative for dizziness, tingling, tremors, sensory change, speech change, focal weakness, seizures, loss of consciousness, weakness and headaches.  Endo/Heme/Allergies: Negative for polydipsia. Does not bruise/bleed easily.  Psychiatric/Behavioral: Negative for depression, suicidal ideas, hallucinations, memory loss and substance abuse. The patient is not nervous/anxious and does not have insomnia.     Blood pressure 115/67, pulse 77, temperature 97.3 F (36.3 C), temperature source Oral, resp. rate 16, height 5' (1.524 m),  weight 49.442 kg (109 lb), last menstrual period 07/07/2012.Body mass index is 21.29 kg/(m^2).  General Appearance: Casual  Eye Contact::  Good  Speech:  Goal directd  Volume:  Increased  Mood:  demanding  Affect:  Less labile more cooperative  Thought Process:  Goal Directed  Orientation:  Full (Time, Place, and Person)  Thought Content:  normal  Suicidal Thoughts:  no  Homicidal Thoughts:  No  Memory:  NA  Judgement:  fair  Insight:  shallow  Psychomotor Activity:  Normal  Concentration:  fair  Recall:  fair  Akathisia:  No  Handed:  Right  AIMS (if indicated):     Assets:  Physical Health  Sleep:  Number of Hours: 5.5   Current Medications: Current Facility-Administered Medications  Medication Dose Route Frequency Provider Last Rate Last Dose  . acetaminophen (TYLENOL) tablet 650 mg  650 mg Oral Q6H PRN Kerry Hough, PA-C      . alum & mag hydroxide-simeth (MAALOX/MYLANTA) 200-200-20 MG/5ML suspension 30 mL  30 mL Oral Q4H PRN Kerry Hough, PA-C      . asenapine (SAPHRIS) sublingual tablet 5 mg  5 mg Sublingual QHS Kerry Hough, PA-C   5 mg at 09/08/12 2146  . benztropine (COGENTIN) tablet 1 mg  1 mg Oral QHS Kerry Hough, PA-C   1 mg at 09/08/12 2146  . [START ON 09/10/2012] chlordiazePOXIDE (LIBRIUM) capsule 25 mg  25 mg Oral Daily Verne Spurr, PA-C      . dicyclomine (BENTYL) tablet 20 mg  20 mg Oral Q6H PRN Verne Spurr, PA-C      . [START ON 09/10/2012] divalproex (DEPAKOTE ER) 24 hr tablet 250 mg  250 mg Oral [address] PA-C      .  divalproex (DEPAKOTE ER) 24 hr tablet 500 mg  500 mg Oral QHS Verne Spurr, PA-C      . feeding supplement (RESOURCE BREEZE) liquid 1 Container  1 Container Oral TID BM Jeoffrey Massed, RD   1 Container at 09/07/12 1258  . levonorgestrel-ethinyl estradiol (SEASONALE,INTROVALE,JOLESSA) 0.15-0.03 MG per tablet 1 tablet  1 tablet Oral Daily Kerry Hough, PA-C   1 tablet at 09/09/12 0800  . magnesium hydroxide (MILK OF  MAGNESIA) suspension 30 mL  30 mL Oral Daily PRN Kerry Hough, PA-C      . methocarbamol (ROBAXIN) tablet 500 mg  500 mg Oral Q8H PRN Verne Spurr, PA-C   500 mg at 09/09/12 0800  . multivitamin with minerals tablet 1 tablet  1 tablet Oral Daily Verne Spurr, PA-C   1 tablet at 09/09/12 0741  . naproxen (NAPROSYN) tablet 500 mg  500 mg Oral BID PRN Verne Spurr, PA-C   500 mg at 09/09/12 0405  . nicotine (NICODERM CQ - dosed in mg/24 hours) patch 21 mg  21 mg Transdermal Q0600 Kerry Hough, PA-C   21 mg at 09/09/12 0654  . ondansetron (ZOFRAN-ODT) disintegrating tablet 4 mg  4 mg Oral Q6H PRN Verne Spurr, PA-C      . thiamine (VITAMIN B-1) tablet 100 mg  100 mg Oral Daily Verne Spurr, PA-C   100 mg at 09/09/12 1478    Lab Results: No results found for this or any previous visit (from the past 48 hour(s)).  Physical Findings:  Denies symptoms of withdrawal.   AIMS: Facial and Oral Movements Muscles of Facial Expression: None, normal Lips and Perioral Area: None, normal Jaw: None, normal Tongue: None, normal,Extremity Movements Upper (arms, wrists, hands, fingers): None, normal Lower (legs, knees, ankles, toes): None, normal, Trunk Movements Neck, shoulders, hips: None, normal, Overall Severity Severity of abnormal movements (highest score from questions above): None, normal Incapacitation due to abnormal movements: None, normal Patient's awareness of abnormal movements (rate only patient's report): No Awareness, Dental Status Current problems with teeth and/or dentures?: Yes Does patient usually wear dentures?: No  CIWA:  CIWA-Ar Total: 1 COWS:  COWS Total Score: 1  Treatment Plan Summary: Daily contact with patient to assess and evaluate symptoms and progress in treatment Medication management  Plan: 1. Will increase her depakote to 500 mg AM, and 500mg  pm for mood stabilization. 2. Continue withdrawal protocols as written. 3. Anticipate D/C upon completion of  protocols. 4. TSH, CBC 5. D/C in AM if continues to be stable, CM can get f/up set up, and labs are normal in AM. Medical Decision Making Problem Points:  Established problem, stable/improving (1), New problem, with no additional work-up planned (3) and Review of psycho-social stressors (1) Data Points:  Review of medication regiment & side effects (2)  I certify that inpatient services furnished can reasonably be expected to improve the patient's condition.  Rona Ravens. Yakir Wenke RPAC 2:46 PM 09/10/2012

## 2012-09-10 NOTE — Tx Team (Addendum)
Interdisciplinary Treatment Plan Update (Adult)  Date: 09/10/2012  Time Reviewed:  9:45 AM  Progress in Treatment: Attending groups: Yes Participating in groups:  Yes Taking medication as prescribed:  Yes Tolerating medication:  Yes Family/Significant othe contact made: Yes Patient understands diagnosis:  Yes Discussing patient identified problems/goals with staff:  Yes Medical problems stabilized or resolved:  Yes Denies suicidal/homicidal ideation: Yes Issues/concerns per patient self-inventory:  Yes Other:  New problem(s) identified: N/A  Discharge Plan or Barriers: Pt will [facility] with [facility] for medication management and therapy.    Reason for Continuation of Hospitalization: Anxiety Depression Medication Stabilization Suicidal Ideation  Comments: N/A  Estimated length of stay: 2-3 days, possibly d/c on Monday  For review of initial/current patient goals, please see plan of care.  Attendees: Patient:      Family:     Physician:  Dr. Thedore Mins 09/10/2012 9:38 AM   Nursing:  Shelda Jakes, RN 09/10/2012 9:38 AM   Clinical Social Worker:  Reyes Ivan, LCSWA 09/10/2012 9:38 AM   Other: Lamount Cranker, RN 09/10/2012 9:38 AM   Other:     Other:     Other:     Other:    Other:    Other:    Other:    Other:    Other:     Scribe for Treatment Team:   Carmina Miller, 09/10/2012 9:38 AM

## 2012-09-10 NOTE — Progress Notes (Signed)
Seen and agreed. Paizlee Kinder, MD 

## 2012-09-11 DIAGNOSIS — F1994 Other psychoactive substance use, unspecified with psychoactive substance-induced mood disorder: Secondary | ICD-10-CM

## 2012-09-11 DIAGNOSIS — F19950 Other psychoactive substance use, unspecified with psychoactive substance-induced psychotic disorder with delusions: Secondary | ICD-10-CM

## 2012-09-11 DIAGNOSIS — F1320 Sedative, hypnotic or anxiolytic dependence, uncomplicated: Secondary | ICD-10-CM

## 2012-09-11 DIAGNOSIS — F1120 Opioid dependence, uncomplicated: Secondary | ICD-10-CM

## 2012-09-11 DIAGNOSIS — F1999 Other psychoactive substance use, unspecified with unspecified psychoactive substance-induced disorder: Secondary | ICD-10-CM

## 2012-09-11 LAB — TSH: TSH: 0.773 u[IU]/mL (ref 0.350–4.500)

## 2012-09-11 MED ORDER — ASENAPINE MALEATE 5 MG SL SUBL
5.0000 mg | SUBLINGUAL_TABLET | Freq: Once | SUBLINGUAL | Status: AC
  Administered 2012-09-11: 5 mg via SUBLINGUAL
  Filled 2012-09-11: qty 1

## 2012-09-11 MED ORDER — ONDANSETRON 4 MG PO TBDP
4.0000 mg | ORAL_TABLET | Freq: Three times a day (TID) | ORAL | Status: DC | PRN
Start: 2012-09-11 — End: 2012-09-12
  Administered 2012-09-11: 4 mg via ORAL

## 2012-09-11 NOTE — Progress Notes (Signed)
Pt complaining of nausea and dizziness at the start of this writer's shift. VS obtained and stable. Pt observed gagging, dry heaving into toilet. MD was paged, order for zofran received and admin without difficulty. Reviewed and strongly encouraged fall precautions however pt did not initially comply. Nausea did resolve and pt able to take depakote and cogentin however refused hs saphris dose. Pt did finally lie down where she continues to rest at this time. Denies SI/HI/AVH and remains safe. Lawrence Marseilles

## 2012-09-11 NOTE — Progress Notes (Signed)
[facility] MD Progress Note  09/11/2012 2:54 PM Rita Baldwin  MRN:  [id number] Subjective: "I'm very lucid today!  My labs are borderline by the hair of my chinny chin chin" Objective: Patient notes that she didn't sleep last night because she was worried about the anemia lab results that were coming back today". Diagnosis:  Opiate dependence, Benzodiazepine dependence, Substance induced psychosis, substance induced mood disorder  ADL's:  Impaired  Sleep: fair  Appetite: Hungry, but everything they make her is spicy.  Suicidal Ideation:  denies Homicidal Ideation:  denies AEB (as evidenced by):per pt report  Psychiatric Specialty Exam: Review of Systems  Constitutional: Negative for fever, weight loss, malaise/fatigue and diaphoresis. Chills: I'm cold all the time.  HENT: Negative for congestion and sore throat.   Eyes: Negative for blurred vision, double vision and photophobia.  Respiratory: Negative for cough, shortness of breath and wheezing.   Cardiovascular: Negative for chest pain, palpitations and PND.  Gastrointestinal: Negative for heartburn, nausea, vomiting, abdominal pain, diarrhea and constipation.  Musculoskeletal: Negative for myalgias, joint pain and falls.  Neurological: Negative for dizziness, tingling, tremors, sensory change, speech change, focal weakness, seizures, loss of consciousness, weakness and headaches.  Endo/Heme/Allergies: Negative for polydipsia. Does not bruise/bleed easily.  Psychiatric/Behavioral: Negative for depression, suicidal ideas, hallucinations, memory loss and substance abuse. The patient is not nervous/anxious and does not have insomnia.     Blood pressure 124/85, pulse 101, temperature 98.8 F (37.1 C), temperature source Oral, resp. rate 18, height 5' (1.524 m), weight 49.442 kg (109 lb), last menstrual period 07/07/2012.Body mass index is 21.29 kg/(m^2).  General Appearance: Casual  Eye Contact::  Good  Speech:  Pressured and pretty  much nonstop  Volume:  Increased  Mood:  demanding  Affect:  Less labile more cooperative  Thought Process:  Goal Directed  Orientation:  Full (Time, Place, and Person)  Thought Content:  normal  Suicidal Thoughts:  no  Homicidal Thoughts:  No  Memory:  NA  Judgement:  fair  Insight:  shallow  Psychomotor Activity:  Normal  Concentration:  fair  Recall:  fair  Akathisia:  No  Handed:  Right  AIMS (if indicated):     Assets:  Physical Health  Sleep:  Number of Hours: 4.25   Current Medications: Current Facility-Administered Medications  Medication Dose Route Frequency Provider Last Rate Last Dose  . acetaminophen (TYLENOL) tablet 650 mg  650 mg Oral Q6H PRN Kerry Hough, PA-C      . alum & mag hydroxide-simeth (MAALOX/MYLANTA) 200-200-20 MG/5ML suspension 30 mL  30 mL Oral Q4H PRN Kerry Hough, PA-C      . asenapine (SAPHRIS) sublingual tablet 5 mg  5 mg Sublingual QHS Kerry Hough, PA-C   5 mg at 09/08/12 2146  . benztropine (COGENTIN) tablet 1 mg  1 mg Oral QHS Kerry Hough, PA-C   1 mg at 09/08/12 2146  . [START ON 09/10/2012] chlordiazePOXIDE (LIBRIUM) capsule 25 mg  25 mg Oral Daily Verne Spurr, PA-C      . dicyclomine (BENTYL) tablet 20 mg  20 mg Oral Q6H PRN Verne Spurr, PA-C      . [START ON 09/10/2012] divalproex (DEPAKOTE ER) 24 hr tablet 250 mg  250 mg Oral BH-q7a Verne Spurr, PA-C      . divalproex (DEPAKOTE ER) 24 hr tablet 500 mg  500 mg Oral QHS Verne Spurr, PA-C      . feeding supplement (RESOURCE BREEZE) liquid 1 Container  1 Container  Oral TID BM Jeoffrey Massed, RD   1 Container at 09/07/12 1258  . levonorgestrel-ethinyl estradiol (SEASONALE,INTROVALE,JOLESSA) 0.15-0.03 MG per tablet 1 tablet  1 tablet Oral Daily Kerry Hough, PA-C   1 tablet at 09/09/12 0800  . magnesium hydroxide (MILK OF MAGNESIA) suspension 30 mL  30 mL Oral Daily PRN Kerry Hough, PA-C      . methocarbamol (ROBAXIN) tablet 500 mg  500 mg Oral Q8H PRN Verne Spurr, PA-C    500 mg at 09/09/12 0800  . multivitamin with minerals tablet 1 tablet  1 tablet Oral Daily Verne Spurr, PA-C   1 tablet at 09/09/12 0741  . naproxen (NAPROSYN) tablet 500 mg  500 mg Oral BID PRN Verne Spurr, PA-C   500 mg at 09/09/12 0405  . nicotine (NICODERM CQ - dosed in mg/24 hours) patch 21 mg  21 mg Transdermal Q0600 Kerry Hough, PA-C   21 mg at 09/09/12 0654  . ondansetron (ZOFRAN-ODT) disintegrating tablet 4 mg  4 mg Oral Q6H PRN Verne Spurr, PA-C      . thiamine (VITAMIN B-1) tablet 100 mg  100 mg Oral Daily Verne Spurr, PA-C   100 mg at 09/09/12 0741    Lab Results:  Results for orders placed during the hospital encounter of 09/06/12 (from the past 48 hour(s))  CBC WITH DIFFERENTIAL     Status: Abnormal   Collection Time    09/10/12  7:53 PM      Result Value Range   WBC 7.0  4.0 - 10.5 K/uL   RBC 4.08  3.87 - 5.11 MIL/uL   Hemoglobin 12.0  12.0 - 15.0 g/dL   HCT 16.1 (*) 09.6 - 04.5 %   MCV 85.3  78.0 - 100.0 fL   MCH 29.4  26.0 - 34.0 pg   MCHC 34.5  30.0 - 36.0 g/dL   RDW 40.9  81.1 - 91.4 %   Platelets 258  150 - 400 K/uL   Neutrophils Relative % 56  43 - 77 %   Lymphocytes Relative 33  12 - 46 %   Monocytes Relative 6  3 - 12 %   Eosinophils Relative 4  0 - 5 %   Basophils Relative 1  0 - 1 %   Neutro Abs 3.9  1.7 - 7.7 K/uL   Lymphs Abs 2.3  0.7 - 4.0 K/uL   Monocytes Absolute 0.4  0.1 - 1.0 K/uL   Eosinophils Absolute 0.3  0.0 - 0.7 K/uL   Basophils Absolute 0.1  0.0 - 0.1 K/uL   WBC Morphology ATYPICAL LYMPHOCYTES     Smear Review LARGE PLATELETS PRESENT    TSH     Status: None   Collection Time    09/10/12  7:53 PM      Result Value Range   TSH 0.773  0.350 - 4.500 uIU/mL   Physical Findings:  Denies symptoms of withdrawal.  AIMS: Facial and Oral Movements Muscles of Facial Expression: None, normal Lips and Perioral Area: None, normal Jaw: None, normal Tongue: None, normal,Extremity Movements Upper (arms, wrists, hands, fingers): None,  normal Lower (legs, knees, ankles, toes): None, normal, Trunk Movements Neck, shoulders, hips: None, normal, Overall Severity Severity of abnormal movements (highest score from questions above): None, normal Incapacitation due to abnormal movements: None, normal Patient's awareness of abnormal movements (rate only patient's report): No Awareness, Dental Status Current problems with teeth and/or dentures?: Yes Does patient usually wear dentures?: No  CIWA:  CIWA-Ar Total:  1 COWS:  COWS Total Score: 1  Treatment Plan Summary: Daily contact with patient to assess and evaluate symptoms and progress in treatment Medication management  Plan: 1. Continue depakote at 500 mg AM, and 500mg  pm for mood stabilization. 2. D/C in AM if psychiatric symptoms improve by AM.  Medical Decision Making Problem Points:  Established problem, stable/improving (1), New problem, with no additional work-up planned (3) and Review of psycho-social stressors (1) Data Points:  Review of medication regiment & side effects (2)  I certify that inpatient services furnished can reasonably be expected to improve the patient's condition.  Orson Aloe, MD, [facility]  09/11/2012 3:02 PM

## 2012-09-11 NOTE — [facility] Group Notes (Signed)
[facility] Group Notes:  (Clinical Social Work)  09/11/2012  11:15-11:45AM  Summary of Progress/Problems:   The main focus of today's process group was for the patient to identify ways in which they have in the past sabotaged their own recovery and reasons they may have done this/what they received from doing it.  We then worked to identify a specific plan to avoid doing this when discharged from the hospital for this admission.  The patient  expressed that her goal in life is to be happy and that the way she sabotages herself from this is to allow an argument to take place between her heart and mind.  She has been feeling that she should not need medications to be acceptable to people, but has come to the conclusion that maybe taking meds is not so bad.  She was hyperverbal at times and somewhat confusing, but redirectable.  Type of Therapy:  Group Therapy - Process  Participation Level:  Active  Participation Quality:  Attentive, Monopolizing, Redirectable and Sharing  Affect:  Blunted  Cognitive:  Disorganized  Insight:  Developing/Improving  Engagement in Therapy:  Engaged  Modes of Intervention:  Clarification, Education, Exploration, Discussion  Ambrose Mantle, LCSW 09/11/2012, 4:39 PM

## 2012-09-11 NOTE — Progress Notes (Signed)
Patient ID: Rita Baldwin, female   DOB: 04-16-1985, 28 y.o.   MRN: [id number] Psychoeducational Group Note  Date:  09/11/2012 Time:1000am  Group Topic/Focus:  Identifying Needs:   The focus of this group is to help patients identify their personal needs that have been historically problematic and identify healthy behaviors to address their needs.  Participation Level:  Active  Participation Quality:  Appropriate  Affect:  Appropriate  Cognitive:  Appropriate  Insight:  Supportive  Engagement in Group:  Supportive  Additional Comments:  Inventory group and Psychoeducational group   Valente David 09/11/2012,10:42 AM

## 2012-09-11 NOTE — Progress Notes (Signed)
The focus of this group is to help patients review their daily goal of treatment and discuss progress on daily workbooks.  Rita Baldwin did not attend wrap up group tonight.

## 2012-09-11 NOTE — Progress Notes (Signed)
NSG 7a-7p shift:  D:  Pt. Has been hypomanic and focused on discharge this shift.  She is intrusive and anxious when making frequent requests to be discharged.  She denies any withdrawal symptoms except occasional "chills".  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   R: Pt.  moderately receptive to intervention/s.  Safety maintained.  Joaquin Music, RN

## 2012-09-12 MED ORDER — DIVALPROEX SODIUM ER 250 MG PO TB24: 250.0000 mg | ORAL_TABLET | ORAL | Status: DC

## 2012-09-12 MED ORDER — LEVONORGEST-ETH ESTRAD 91-DAY 0.15-0.03 MG PO TABS: 1.0000 | ORAL_TABLET | Freq: Every day | ORAL | Status: DC

## 2012-09-12 MED ORDER — ASENAPINE MALEATE 5 MG SL SUBL: 5.0000 mg | SUBLINGUAL_TABLET | Freq: Every day | SUBLINGUAL | Status: DC

## 2012-09-12 MED ORDER — ASENAPINE MALEATE 5 MG SL SUBL
5.0000 mg | SUBLINGUAL_TABLET | Freq: Every day | SUBLINGUAL | Status: DC
  Filled 2012-09-12: qty 1

## 2012-09-12 MED ORDER — METHOCARBAMOL 500 MG PO TABS: 500.0000 mg | ORAL_TABLET | Freq: Three times a day (TID) | ORAL | Status: DC | PRN

## 2012-09-12 MED ORDER — BENZTROPINE MESYLATE 1 MG PO TABS: 1.0000 mg | ORAL_TABLET | Freq: Every day | ORAL | Status: DC

## 2012-09-12 MED ORDER — DIVALPROEX SODIUM ER 500 MG PO TB24
500.0000 mg | ORAL_TABLET | Freq: Every day | ORAL | Status: DC
  Filled 2012-09-12: qty 1

## 2012-09-12 MED ORDER — CHLORDIAZEPOXIDE HCL 25 MG PO CAPS: 25.0000 mg | ORAL_CAPSULE | Freq: Every day | ORAL | Status: DC

## 2012-09-12 MED ORDER — DIVALPROEX SODIUM ER 500 MG PO TB24: 500.0000 mg | ORAL_TABLET | Freq: Every day | ORAL | Status: DC

## 2012-09-12 MED ORDER — METHOCARBAMOL 500 MG PO TABS
500.0000 mg | ORAL_TABLET | Freq: Three times a day (TID) | ORAL | Status: DC | PRN
  Filled 2012-09-12: qty 10

## 2012-09-12 MED ORDER — BENZTROPINE MESYLATE 1 MG PO TABS
1.0000 mg | ORAL_TABLET | Freq: Every day | ORAL | Status: DC
  Filled 2012-09-12: qty 1

## 2012-09-12 NOTE — Progress Notes (Signed)
Patient ID: Rita Baldwin, female   DOB: [birth year], 27 y.o.   MRN: [id number] Patient denies si/hi/avh. Pt verbalizes understanding of discharge instructions, prescriptions and follow up appts. She was given medication samples and her home medications were rtned from her [address] medication drawer. Pt signed for their belongings from locker. Pt was walked to the lobby and was transported by step father.

## 2012-09-12 NOTE — Progress Notes (Signed)
Patient ID: Rita Baldwin, female   DOBPsychoeducational Group Note  Date:  09/12/2012 Time:  1000am  Group Topic/Focus:  Making Healthy Choices:   The focus of this group is to help patients identify negative/unhealthy choices they were using prior to admission and identify positive/healthier coping strategies to replace them upon discharge.  Participation Level:  Active  Participation Quality:  Appropriate  Affect:  Anxious  Cognitive:  Appropriate  Insight:  Supportive  Engagement in Group:  Supportive  Additional Comments:  Inventory group and Psychoeducational group   Valente David 09/12/2012,10:06 AM: [birth year], 27 y.o.   MRN: [id number]

## 2012-09-12 NOTE — [facility] Group Notes (Signed)
[facility] Group Notes:  (Clinical Social Work)  09/12/2012   11:15-11:45AM  Summary of Progress/Problems:  The main focus of today's process group was to listen to a variety of genres of music and to identify that different types of music provoke different responses.  The patient then was able to identify personally what was soothing for them, as well as energizing.  Handouts were used to record feelings evoked, as well as how patient can personally use this knowledge in sleep habits, with depression, and with other symptoms.  The patient expressed little, as she did not join the group (due to being with doctor) until toward the end.  She is relieved to be discharging.  Type of Therapy:  Music Therapy   Participation Level:  Minimal  Participation Quality:  Attentive  Affect:  Blunted  Cognitive:  Appropriate  Insight:  Engaged  Engagement in Therapy:  Engaged  Modes of Intervention:   Activity, Exploration  Ambrose Mantle, LCSW 09/12/2012, 12:35 PM
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
## 2012-09-12 NOTE — Progress Notes (Signed)
Pt up at med window at 2340 requesting saphris which she earlier refused. "I just can't sleep." Admin without difficulty. Pt resting at this time. Rita Baldwin

## 2012-09-12 NOTE — Discharge Summary (Signed)
Physician Discharge Summary Note  Patient:  Rita Baldwin is an 27 y.o., female MRN:  [id number] DOB:  April 28, 1985 Patient phone:  [phone] (home)  Patient address:   [address] [place] [address]   Date of Admission:  09/06/2012 Date of Discharge:  09/12/2012  Discharge Diagnoses: Principal Problem:   Opiate dependence Active Problems:   Benzodiazepine dependence   Substance-induced psychotic disorder with delusions   Substance induced mood disorder  Axis Diagnosis:  AXIS I: Opiate dependence, Benzodiazepine dependence, Substance induced psychosis, substance induced mood disorder  AXIS II: Deferred  AXIS III:  Past Medical History   Diagnosis  Date   .  Drug abuse    .  Overdose     AXIS IV: other psychosocial or environmental problems  AXIS V: 51-60 moderate symptoms  Level of Care:  OP  Hospital Course:   Pt presented to the ED for paranoia.  She is withdrawing from Methadone.  She had gotten suicidal and became distressed.  While a patient in this hospital, Rita Baldwin was enrolled in group counseling and activities as well as received the following medication Current facility-administered medications:acetaminophen (TYLENOL) tablet 650 mg, 650 mg, Oral, Q6H PRN, Kerry Hough, PA-C;  alum & mag hydroxide-simeth (MAALOX/MYLANTA) 200-200-20 MG/5ML suspension 30 mL, 30 mL, Oral, Q4H PRN, Kerry Hough, PA-C;  asenapine (SAPHRIS) sublingual tablet 5 mg, 5 mg, Sublingual, QHS, Spencer E Simon, PA-C, 5 mg at 09/11/12 2340 benztropine (COGENTIN) tablet 1 mg, 1 mg, Oral, QHS, Spencer E Simon, PA-C, 1 mg at 09/11/12 2119;  chlordiazePOXIDE (LIBRIUM) capsule 25 mg, 25 mg, Oral, Daily, Verne Spurr, PA-C, 25 mg at 09/12/12 0735;  dicyclomine (BENTYL) tablet 20 mg, 20 mg, Oral, Q6H PRN, Verne Spurr, PA-C;  divalproex (DEPAKOTE ER) 24 hr tablet 250 mg, 250 mg, Oral, BH-q7a, Neil Mashburn, PA-C, 250 mg at 09/12/12 0735 divalproex (DEPAKOTE ER) 24 hr tablet 500 mg, 500  mg, Oral, QHS, Verne Spurr, PA-C, 500 mg at 09/11/12 2118;  feeding supplement (RESOURCE BREEZE) liquid 1 Container, 1 Container, Oral, TID BM, Jeoffrey Massed, RD, 1 Container at 09/07/12 1258;  levonorgestrel-ethinyl estradiol (SEASONALE,INTROVALE,JOLESSA) 0.15-0.03 MG per tablet 1 tablet, 1 tablet, Oral, Daily, Kerry Hough, PA-C, 1 tablet at 09/12/12 0735 magnesium hydroxide (MILK OF MAGNESIA) suspension 30 mL, 30 mL, Oral, Daily PRN, Kerry Hough, PA-C;  methocarbamol (ROBAXIN) tablet 500 mg, 500 mg, Oral, Q8H PRN, Verne Spurr, PA-C, 500 mg at 09/11/12 2130;  multivitamin with minerals tablet 1 tablet, 1 tablet, Oral, Daily, Verne Spurr, PA-C, 1 tablet at 09/12/12 0736;  naproxen (NAPROSYN) tablet 500 mg, 500 mg, Oral, BID PRN, Verne Spurr, PA-C, 500 mg at 09/11/12 1311 nicotine (NICODERM CQ - dosed in mg/24 hours) patch 21 mg, 21 mg, Transdermal, Q0600, Kerry Hough, PA-C, 21 mg at 09/12/12 0736;  ondansetron (ZOFRAN-ODT) disintegrating tablet 4 mg, 4 mg, Oral, Q8H PRN, Mike Craze, MD, 4 mg at 09/11/12 2003;  thiamine (VITAMIN B-1) tablet 100 mg, 100 mg, Oral, Daily, Verne Spurr, PA-C, 100 mg at 09/12/12 8657  Patient attended treatment team meeting this am and met with treatment team members. Pt symptoms, treatment plan and response to treatment discussed. Rita Baldwin endorsed that their symptoms have improved. Pt also stated that they are stable for discharge.  In other to control Principal Problem:   Opiate dependence Active Problems:   Benzodiazepine dependence   Substance-induced psychotic disorder with delusions   Substance induced mood disorder , they will  continue psychiatric care on outpatient basis. They will follow-up at  Follow-up Information   Follow up with [facility] On 09/30/2012. (Appointment scheduled at 9:45 am with Dr. Evelene Croon for medication management (CSW confirmed appt with patient but records won't be sent, patient refused))     Contact information:   [address],  [place], [place] [address]  Phone: [phone] Fax: [phone]      Schedule an appointment as soon as possible for a visit with [facility]. (Patient refused to send records, will schedule own appointment for therapy with Onalee Hua)    Contact information:   [address], [address] [place], [place] [address]. Phone: [phone]. Fax: [phone]    .  In addition they were instructed to take all your medications as prescribed by your mental healthcare provider, to report any adverse effects and or reactions from your medicines to your outpatient provider promptly, patient is instructed and cautioned to not engage in alcohol and or illegal drug use while on prescription medicines, in the event of worsening symptoms, patient is instructed to call the crisis hotline, 911 and or go to the nearest ED for appropriate evaluation and treatment of symptoms.   Upon discharge, patient adamantly denies suicidal, homicidal ideations, auditory, visual hallucinations and or delusional thinking. They left [facility] with all personal belongings in no apparent distress.  Consults:  See electronic record for details  Significant Diagnostic Studies:  See electronic record for details  Discharge Vitals:   Blood pressure 108/77, pulse 104, temperature 98.2 F (36.8 C), temperature source Oral, resp. rate 16, height 5' (1.524 m), weight 49.442 kg (109 lb), last menstrual period 07/07/2012..  Mental Status Exam: See Mental Status Examination and Suicide Risk Assessment completed by Attending Physician prior to discharge.  Discharge destination:  Home  Is patient on multiple antipsychotic therapies at discharge:  No  Has Patient had three or more failed trials of antipsychotic monotherapy by history: N/A Recommended Plan for Multiple Antipsychotic Therapies: N/A    Medication List    STOP taking these medications       amoxicillin 500  MG capsule  Commonly known as:  AMOXIL     benztropine 1 MG tablet  Commonly known as:  COGENTIN     cloNIDine 0.1 MG tablet  Commonly known as:  CATAPRES     haloperidol 5 MG tablet  Commonly known as:  HALDOL     HYDROcodone-acetaminophen 7.5-325 MG per tablet  Commonly known as:  NORCO     ibuprofen 200 MG tablet  Commonly known as:  ADVIL,MOTRIN     methadone 10 MG/ML solution  Commonly known as:  DOLOPHINE      TAKE these medications     Indication   asenapine 5 MG Subl  Commonly known as:  SAPHRIS  Place 1 tablet (5 mg total) under the tongue at bedtime.      chlordiazePOXIDE 25 MG capsule  Commonly known as:  LIBRIUM  Take 1 capsule (25 mg total) by mouth daily.   Indication:  Benzodiazepine withdrawal     divalproex 250 MG 24 hr tablet  Commonly known as:  DEPAKOTE ER  Take 1 tablet (250 mg total) by mouth every morning.   Indication:  mood stabilization     divalproex 500 MG 24 hr tablet  Commonly known as:  DEPAKOTE ER  Take 1 tablet (500 mg total) by mouth at bedtime.      levonorgestrel-ethinyl estradiol 0.15-0.03 MG  tablet  Commonly known as:  SEASONALE  Take 1 tablet by mouth daily.      methocarbamol 500 MG tablet  Commonly known as:  ROBAXIN  Take 1 tablet (500 mg total) by mouth every 8 (eight) hours as needed.      naproxen 250 MG tablet  Commonly known as:  NAPROSYN  Take 250 mg by mouth daily as needed (pain).      PRESCRIPTION MEDICATION  Take 1 tablet by mouth 3 (three) times daily. Vitadone            Follow-up Information   Follow up with [facility] On 09/30/2012. (Appointment scheduled at 9:45 am with Dr. Evelene Croon for medication management (CSW confirmed appt with patient but records won't be sent, patient refused))    Contact information:   [address],  [place], [place] [address]  Phone: [phone] Fax: [phone]      Schedule an appointment as soon as possible for a visit with [facility]. (Patient refused to send records, will schedule own appointment for therapy with Onalee Hua)    Contact information:   [address], [address] [place], [place] [address]. Phone: [phone]. Fax: [phone]     Follow-up recommendations:   Activities: Resume typical activities Diet: Resume typical diet Tests: none Other: Follow up with outpatient provider and report any side effects to out patient prescriber.  Comments:  Take all your medications as prescribed by your mental healthcare provider. Report any adverse effects and or reactions from your medicines to your outpatient provider promptly. Patient is instructed and cautioned to not engage in alcohol and or illegal drug use while on prescription medicines. In the event of worsening symptoms, patient is instructed to call the crisis hotline, 911 and or go to the nearest ED for appropriate evaluation and treatment of symptoms. Follow-up with your primary care provider for your other medical issues, concerns and or health care needs.  SignedOrson Aloe 09/12/2012 11:39 AM

## 2012-09-12 NOTE — Progress Notes (Addendum)
Pt is stating she is ready to leave today and is hopeful this will happen today. Her depression is a 1/10 today and feelings of hopelessness is a 3/10/ pt would like to enroll in school and get a job. She would like to spend more time with her dog and go for more walks. Pt did go to group and participated in group. She states she  often gets frustrated as she can never live up to her mother's expectations.She told the group that she will clean as instructed by her mother and then her mother always points out the negative and never praises her for her work. Pt stated this disappoints her greatly . She vividly remembers making all A's and one B on one report card. The only thing the mom could focus on was the B and never gave the pt any praises for all of her accomplishments.pt stated,'I have to work through this and just walk away because I will never change the way my mother is.I can change my actions but not hers. Pt is very insightful."pt is for discharge today.

## 2012-09-13 NOTE — Progress Notes (Signed)
Seen and agreed. Bastian Andreoli, MD 

## 2012-09-15 NOTE — Progress Notes (Signed)
Patient Discharge Instructions:  No documentation sent to [facility] & [facility].  Per the ROI & AVS the patient refused to have records sent to these facilities.  Rita Baldwin, 09/15/2012, 2:25 PM

## 2012-11-07 ENCOUNTER — Encounter (HOSPITAL_COMMUNITY): Payer: Self-pay | Admitting: *Deleted

## 2012-11-07 ENCOUNTER — Emergency Department (HOSPITAL_COMMUNITY)
Admission: EM | Admit: 2012-11-07 | Discharge: 2012-11-08 | Disposition: A | Discharge status: Home / Self Care | Payer: Self-pay | Attending: Emergency Medicine | Admitting: Emergency Medicine

## 2012-11-07 DIAGNOSIS — Z3202 Encounter for pregnancy test, result negative: Secondary | ICD-10-CM | POA: Insufficient documentation

## 2012-11-07 DIAGNOSIS — Z8619 Personal history of other infectious and parasitic diseases: Secondary | ICD-10-CM | POA: Insufficient documentation

## 2012-11-07 DIAGNOSIS — N390 Urinary tract infection, site not specified: Secondary | ICD-10-CM | POA: Insufficient documentation

## 2012-11-07 DIAGNOSIS — Z8742 Personal history of other diseases of the female genital tract: Secondary | ICD-10-CM | POA: Insufficient documentation

## 2012-11-07 DIAGNOSIS — R109 Unspecified abdominal pain: Secondary | ICD-10-CM | POA: Insufficient documentation

## 2012-11-07 DIAGNOSIS — N898 Other specified noninflammatory disorders of vagina: Secondary | ICD-10-CM | POA: Insufficient documentation

## 2012-11-07 DIAGNOSIS — R259 Unspecified abnormal involuntary movements: Secondary | ICD-10-CM | POA: Insufficient documentation

## 2012-11-07 DIAGNOSIS — Z79899 Other long term (current) drug therapy: Secondary | ICD-10-CM | POA: Insufficient documentation

## 2012-11-07 DIAGNOSIS — K921 Melena: Secondary | ICD-10-CM | POA: Insufficient documentation

## 2012-11-07 DIAGNOSIS — K5900 Constipation, unspecified: Secondary | ICD-10-CM | POA: Insufficient documentation

## 2012-11-07 DIAGNOSIS — R509 Fever, unspecified: Secondary | ICD-10-CM | POA: Insufficient documentation

## 2012-11-07 DIAGNOSIS — F17200 Nicotine dependence, unspecified, uncomplicated: Secondary | ICD-10-CM | POA: Insufficient documentation

## 2012-11-07 DIAGNOSIS — R51 Headache: Secondary | ICD-10-CM | POA: Insufficient documentation

## 2012-11-07 DIAGNOSIS — R110 Nausea: Secondary | ICD-10-CM | POA: Insufficient documentation

## 2012-11-07 DIAGNOSIS — N949 Unspecified condition associated with female genital organs and menstrual cycle: Secondary | ICD-10-CM | POA: Insufficient documentation

## 2012-11-07 LAB — COMPREHENSIVE METABOLIC PANEL
Albumin: 4.2 g/dL (ref 3.5–5.2)
Alkaline Phosphatase: 53 U/L (ref 39–117)
BUN: 5 mg/dL — ABNORMAL LOW (ref 6–23)
CO2: 25 mEq/L (ref 19–32)
Chloride: 96 mEq/L (ref 96–112)
Creatinine, Ser: 0.65 mg/dL (ref 0.50–1.10)
GFR calc Af Amer: >90 mL/min (ref >90)
GFR calc non Af Amer: >90 mL/min (ref >90)
Glucose, Bld: 81 mg/dL (ref 70–99)
Potassium: 3.6 mEq/L (ref 3.5–5.1)
Total Bilirubin: 0.3 mg/dL (ref 0.3–1.2)

## 2012-11-07 LAB — CBC WITH DIFFERENTIAL/PLATELET
HCT: 40.6 % (ref 36.0–46.0)
Hemoglobin: 13.7 g/dL (ref 12.0–15.0)
Lymphs Abs: 0.8 10*3/uL (ref 0.7–4.0)
Monocytes Absolute: 0.4 10*3/uL (ref 0.1–1.0)
Monocytes Relative: 4 % (ref 3–12)
Neutro Abs: 10.5 10*3/uL — ABNORMAL HIGH (ref 1.7–7.7)
Neutrophils Relative %: 89 % — ABNORMAL HIGH (ref 43–77)
RBC: 4.39 MIL/uL (ref 3.87–5.11)

## 2012-11-07 LAB — LIPASE, BLOOD: Lipase: 29 U/L (ref 11–59)

## 2012-11-07 LAB — WET PREP, GENITAL

## 2012-11-07 LAB — URINALYSIS, ROUTINE W REFLEX MICROSCOPIC
Glucose, UA: NEGATIVE mg/dL
Protein, ur: NEGATIVE mg/dL
Specific Gravity, Urine: 1.01 (ref 1.005–1.030)

## 2012-11-07 LAB — URINE MICROSCOPIC-ADD ON

## 2012-11-07 LAB — PREGNANCY, URINE: Preg Test, Ur: NEGATIVE

## 2012-11-07 MED ORDER — SODIUM CHLORIDE 0.9 % IV BOLUS (SEPSIS): 500.0000 mL | Freq: Once | INTRAVENOUS | Status: DC

## 2012-11-07 MED ORDER — SULFAMETHOXAZOLE-TRIMETHOPRIM 800-160 MG PO TABS: 1.0000 | ORAL_TABLET | Freq: Two times a day (BID) | ORAL | Status: DC

## 2012-11-07 NOTE — ED Provider Notes (Signed)
CSN: [id number]     Arrival date & time 11/07/12  2018 History  This chart was scribed for [employer]. Rubin Payor, MD, by Yevette Edwards, ED Scribe. This patient was seen in room APA12/APA12 and the patient's care was started at 8:38 PM.   First MD Initiated Contact with Patient 11/07/12 2034     Chief Complaint  Patient presents with  . Fever  . Constipation  . Abdominal Pain  . Headache    The history is provided by the patient. No language interpreter was used.   HPI Comments: Rita Baldwin is a 27 y.o. female who presents to the Emergency Department complaining of sudden-onset tremors which have been occurring intermittently for the past three days. She states that she was a drug user, and her current tremors are similar to tremors she experienced when she experienced D.T.s. She reports that her last methadone treatment three months ago, and that she last smoked marijuana four days ago. The pt has also experienced fever, a headache, mild abdominal pain, nausea, constipation, pelvic pressure, and tan-colored vaginal discharge as associated symptoms. The pt is also concerned that she has chlamydia; she has a h/o of chlamydia and pelvic inflammatory disease. She states that she recently engaged in anal intercourse followed by vaginal intercourse. Due to taking Seasonale birth control, she has five more weeks until her next menses. Additionally, two weeks ago, she experienced hematochezia in which the stool was green-black with red-colored blood present, but she states that she has experienced hematochezia previously when she is under increased stress. She denies any emesis, sore throat, or cough. She is a daily smoker, but she denies alcohol usage.    Past Medical History  Diagnosis Date  . Drug abuse   . Overdose    Past Surgical History  Procedure Laterality Date  . Multiple tooth extractions     No family history on file. History  Substance Use Topics  . Smoking status: Current  Every Day Smoker -- 1.00 packs/day for 9 years  . Smokeless tobacco: Not on file  . Alcohol Use: No   No OB history provided.  Review of Systems  Constitutional: Positive for fever.  Respiratory: Negative for cough.   Gastrointestinal: Positive for nausea, abdominal pain, constipation and blood in stool. Negative for vomiting.  Genitourinary: Positive for vaginal discharge and pelvic pain (Described as pressure rather than pain).  Neurological: Positive for tremors and headaches.    Allergies  Review of patient's allergies indicates no known allergies.  Home Medications   Current Outpatient Rx  Name  Route  Sig  Dispense  Refill  . chlordiazePOXIDE (LIBRIUM) 25 MG capsule   Oral   Take 25 mg by mouth 3 (three) times daily as needed for anxiety.         . cloNIDine (CATAPRES) 0.1 MG tablet   Oral   Take 0.1 mg by mouth 3 (three) times daily as needed. sleep         . levonorgestrel-ethinyl estradiol (SEASONALE) 0.15-0.03 MG tablet   Oral   Take 1 tablet by mouth daily.   1 Package   4     For contraception   . sulfamethoxazole-trimethoprim (BACTRIM DS,SEPTRA DS) 800-160 MG per tablet   Oral   Take 1 tablet by mouth 2 (two) times daily.   6 tablet   0    Triage Vitals: BP 114/82  Pulse 110  Temp(Src) 99.8 F (37.7 C) (Oral)  Resp 24  Ht 5\' 1"  (1.549 m)  Wt 100 lb (45.36 kg)  BMI 18.9 kg/m2  SpO2 100%  Physical Exam  Nursing note and vitals reviewed. Constitutional: She is oriented to person, place, and time. She appears well-developed and well-nourished. No distress.  HENT:  Head: Normocephalic and atraumatic.  Eyes: EOM are normal.  Neck: Neck supple. No tracheal deviation present.  Cardiovascular: Normal rate.   Pulmonary/Chest: Effort normal. No respiratory distress.  Abdominal: There is no rebound and no guarding.  Mild upper quadrant tenderness without rebound or guarding.   Musculoskeletal: Normal range of motion.  Neurological: She is alert  and oriented to person, place, and time.  Skin: Skin is warm and dry.  Psychiatric: Her behavior is normal.  Appeared mildly anxious.     ED Course   DIAGNOSTIC STUDIES:  Oxygen Saturation is 100% on room air, normal by my interpretation.    COORDINATION OF CARE:  8:43 PM-Discussed treatment plan with patient, and the patient agreed to the plan.   Procedures (including critical care time)  Labs Reviewed  WET PREP, GENITAL - Abnormal; Notable for the following:    Clue Cells Wet Prep HPF POC FEW (*)    WBC, Wet Prep HPF POC FEW (*)    All other components within normal limits  CBC WITH DIFFERENTIAL - Abnormal; Notable for the following:    WBC 11.8 (*)    Neutrophils Relative % 89 (*)    Neutro Abs 10.5 (*)    Lymphocytes Relative 7 (*)    All other components within normal limits  COMPREHENSIVE METABOLIC PANEL - Abnormal; Notable for the following:    Sodium 133 (*)    BUN 5 (*)    Total Protein 8.6 (*)    All other components within normal limits  URINALYSIS, ROUTINE W REFLEX MICROSCOPIC - Abnormal; Notable for the following:    Hgb urine dipstick SMALL (*)    Leukocytes, UA MODERATE (*)    All other components within normal limits  URINE MICROSCOPIC-ADD ON - Abnormal; Notable for the following:    Squamous Epithelial / LPF MANY (*)    Bacteria, UA MANY (*)    All other components within normal limits  GC/CHLAMYDIA PROBE AMP  URINE CULTURE  LIPASE, BLOOD  PREGNANCY, URINE   No results found. 1. UTI (urinary tract infection)   2. Abdominal pain     MDM  Patient presents with tremors. States it is like when she had drug withdrawal in the past. She also states she's had fevers and abdominal pain. There is some lower abdominal tenderness. Patient also states her throat upper abdominal pain. There is mild tenderness there. Patient has left adnexal tenderness on pelvic exam. She has likely UTI and that'll be treated. Lab work is overall reassuring. I do not feel she  needs a CT at this time. There is minimal pelvic discharge I doubt this is a Fitz-Hugh-Curtis syndrome. We'll treat UTI and will need followup if pain does not improve.  I personally performed the services described in this documentation, which was scribed in my presence. The recorded information has been reviewed and is accurate.     Juliet Rude. Rubin Payor, MD 11/08/12 [phone]

## 2012-11-07 NOTE — ED Notes (Addendum)
Pt states she has been having abdominal pain, fever, and constipation (no BM in 3 days). Pt states she passed blood in her stool 2 weeks ago. Pt states she had anal and vaginal sex recently and states she has tan colored vaginal discharge. Pt also c/o a headache.

## 2012-11-09 LAB — URINE CULTURE

## 2012-11-11 NOTE — ED Notes (Signed)
Patient treated with Sulfa-Trim-sensitive to same-chart appended per protocol MD 

## 2012-11-29 ENCOUNTER — Encounter (HOSPITAL_COMMUNITY): Payer: Self-pay | Admitting: *Deleted

## 2012-11-29 ENCOUNTER — Emergency Department (HOSPITAL_COMMUNITY)
Admission: EM | Admit: 2012-11-29 | Discharge: 2012-11-30 | Disposition: A | Discharge status: Home / Self Care | Payer: Self-pay | Attending: Emergency Medicine | Admitting: Emergency Medicine

## 2012-11-29 ENCOUNTER — Emergency Department (HOSPITAL_COMMUNITY): Payer: Self-pay

## 2012-11-29 DIAGNOSIS — F17200 Nicotine dependence, unspecified, uncomplicated: Secondary | ICD-10-CM | POA: Insufficient documentation

## 2012-11-29 DIAGNOSIS — Z792 Long term (current) use of antibiotics: Secondary | ICD-10-CM | POA: Insufficient documentation

## 2012-11-29 DIAGNOSIS — R1013 Epigastric pain: Secondary | ICD-10-CM | POA: Insufficient documentation

## 2012-11-29 DIAGNOSIS — Z3202 Encounter for pregnancy test, result negative: Secondary | ICD-10-CM | POA: Insufficient documentation

## 2012-11-29 DIAGNOSIS — Z8659 Personal history of other mental and behavioral disorders: Secondary | ICD-10-CM | POA: Insufficient documentation

## 2012-11-29 DIAGNOSIS — N390 Urinary tract infection, site not specified: Secondary | ICD-10-CM

## 2012-11-29 DIAGNOSIS — J209 Acute bronchitis, unspecified: Secondary | ICD-10-CM

## 2012-11-29 DIAGNOSIS — R110 Nausea: Secondary | ICD-10-CM | POA: Insufficient documentation

## 2012-11-29 DIAGNOSIS — Z79899 Other long term (current) drug therapy: Secondary | ICD-10-CM | POA: Insufficient documentation

## 2012-11-29 LAB — BASIC METABOLIC PANEL
GFR calc Af Amer: >90 mL/min (ref >90)
GFR calc non Af Amer: >90 mL/min (ref >90)
Potassium: 3.9 mEq/L (ref 3.5–5.1)
Sodium: 134 mEq/L — ABNORMAL LOW (ref 135–145)

## 2012-11-29 LAB — URINE MICROSCOPIC-ADD ON

## 2012-11-29 LAB — URINALYSIS, ROUTINE W REFLEX MICROSCOPIC
Bilirubin Urine: NEGATIVE
Nitrite: POSITIVE — AB
Specific Gravity, Urine: 1.02 (ref 1.005–1.030)
Urobilinogen, UA: 4 mg/dL — ABNORMAL HIGH (ref 0.0–1.0)

## 2012-11-29 LAB — POCT PREGNANCY, URINE: Preg Test, Ur: NEGATIVE

## 2012-11-29 MED ORDER — IBUPROFEN 800 MG PO TABS
800.0000 mg | ORAL_TABLET | Freq: Once | ORAL | Status: AC
  Administered 2012-11-29: 800 mg via ORAL
  Filled 2012-11-29: qty 1

## 2012-11-29 MED ORDER — ACETAMINOPHEN 325 MG PO TABS
650.0000 mg | ORAL_TABLET | Freq: Once | ORAL | Status: AC
  Administered 2012-11-29: 650 mg via ORAL
  Filled 2012-11-29: qty 2

## 2012-11-29 MED ORDER — ALBUTEROL SULFATE (5 MG/ML) 0.5% IN NEBU
2.5000 mg | INHALATION_SOLUTION | Freq: Once | RESPIRATORY_TRACT | Status: AC
  Administered 2012-11-29: 2.5 mg via RESPIRATORY_TRACT
  Filled 2012-11-29: qty 0.5

## 2012-11-29 MED ORDER — IPRATROPIUM BROMIDE 0.02 % IN SOLN
0.5000 mg | Freq: Once | RESPIRATORY_TRACT | Status: AC
  Administered 2012-11-29: 0.5 mg via RESPIRATORY_TRACT
  Filled 2012-11-29: qty 2.5

## 2012-11-29 NOTE — ED Provider Notes (Signed)
CSN: [id number]     Arrival date & time 11/29/12  1952 History  This chart was scribed for Dione Booze, MD by Arlan Organ, ED Scribe. This patient was seen in room APA06/APA06 and the patient's care was started 12:12 AM.    Chief Complaint  Patient presents with  . Abdominal Pain   The history is provided by the patient. No language interpreter was used.   HPI Comments: ZAKIYAH DIOP is a 27 y.o. female who presents to the Emergency Department complaining of a productive cough with green colored sputum for the last 4 days. Pt also reports associated left flank pain, SOB, and nausea. She rates the flank pain as 8/10, and states it is worse with deep breathing, twisting, sitting up. Pt states she has taken nyquil, dayquil, and aleve with mild relief. Pt denies diarrhea. Pt reports smoking 1 pack of cigarettes a day.   No PCP, but sees a psychiatrist here in town.   Past Medical History  Diagnosis Date  . Drug abuse   . Overdose    Past Surgical History  Procedure Laterality Date  . Multiple tooth extractions     History reviewed. No pertinent family history. History  Substance Use Topics  . Smoking status: Current Every Day Smoker -- 1.00 packs/day for 9 years  . Smokeless tobacco: Not on file  . Alcohol Use: No   OB History   Grav Para Term Preterm Abortions TAB SAB Ect Mult Living                 Review of Systems  Respiratory: Positive for cough.   Genitourinary: Positive for flank pain.  All other systems reviewed and are negative.    Allergies  Review of patient's allergies indicates no known allergies.  Home Medications   Current Outpatient Rx  Name  Route  Sig  Dispense  Refill  . chlordiazePOXIDE (LIBRIUM) 25 MG capsule   Oral   Take 25 mg by mouth 3 (three) times daily as needed for anxiety.         . cloNIDine (CATAPRES) 0.1 MG tablet   Oral   Take 0.1 mg by mouth 3 (three) times daily as needed. sleep         . levonorgestrel-ethinyl  estradiol (SEASONALE) 0.15-0.03 MG tablet   Oral   Take 1 tablet by mouth daily.   1 Package   4     For contraception   . sulfamethoxazole-trimethoprim (BACTRIM DS,SEPTRA DS) 800-160 MG per tablet   Oral   Take 1 tablet by mouth 2 (two) times daily.   6 tablet   0    BP 119/70  Pulse 100  Temp(Src) 98.3 F (36.8 C) (Oral)  Resp 24  Ht 5\' 1"  (1.549 m)  Wt 107 lb (48.535 kg)  BMI 20.23 kg/m2  SpO2 100% Physical Exam  Nursing note and vitals reviewed. Constitutional: She is oriented to person, place, and time. She appears well-developed and well-nourished.  HENT:  Head: Normocephalic and atraumatic.  Eyes: Conjunctivae and EOM are normal. Pupils are equal, round, and reactive to light.  Neck: Normal range of motion. Neck supple.  Cardiovascular: Normal rate, regular rhythm and normal heart sounds.   Pulmonary/Chest: Effort normal and breath sounds normal.  Slightly prolonged exhalation phase Mild to moderate tenderness to palpation over left lateral lower rib cage.  Abdominal: Soft. Bowel sounds are normal. There is tenderness.  Mild tenderness over epigastric and  LUQ region.  Musculoskeletal: Normal range of  motion.  Neurological: She is alert and oriented to person, place, and time.  Skin: Skin is warm and dry.  Psychiatric: She has a normal mood and affect.    ED Course  Procedures (including critical care time)  DIAGNOSTIC STUDIES: Oxygen Saturation is 100% on RA, Normal by my interpretation.    COORDINATION OF CARE: 12:10 AM- Will order CXR, labs, and UA. Will give pain medication and breathing treatment. .Discussed treatment plan with pt at bedside and pt agreed to plan.     Labs Review Results for orders placed during the hospital encounter of 11/29/12  CBC WITH DIFFERENTIAL      Result Value Range   WBC 12.4 (*) 4.0 - 10.5 K/uL   RBC 3.76 (*) 3.87 - 5.11 MIL/uL   Hemoglobin 11.5 (*) 12.0 - 15.0 g/dL   HCT 16.1 (*) 09.6 - 04.5 %   MCV 89.4  78.0 -  100.0 fL   MCH 30.6  26.0 - 34.0 pg   MCHC 34.2  30.0 - 36.0 g/dL   RDW 40.9  81.1 - 91.4 %   Platelets 195  150 - 400 K/uL   Neutrophils Relative % 78 (*) 43 - 77 %   Neutro Abs 9.7 (*) 1.7 - 7.7 K/uL   Lymphocytes Relative 14  12 - 46 %   Lymphs Abs 1.6  0.7 - 4.0 K/uL   Monocytes Relative 6  3 - 12 %   Monocytes Absolute 0.8  0.1 - 1.0 K/uL   Eosinophils Relative 2  0 - 5 %   Eosinophils Absolute 0.3  0.0 - 0.7 K/uL   Basophils Relative 0  0 - 1 %   Basophils Absolute 0.0  0.0 - 0.1 K/uL  BASIC METABOLIC PANEL      Result Value Range   Sodium 134 (*) 135 - 145 mEq/L   Potassium 3.9  3.5 - 5.1 mEq/L   Chloride 101  96 - 112 mEq/L   CO2 23  19 - 32 mEq/L   Glucose, Bld 126 (*) 70 - 99 mg/dL   BUN 12  6 - 23 mg/dL   Creatinine, Ser 7.82  0.50 - 1.10 mg/dL   Calcium 9.2  8.4 - 95.6 mg/dL   GFR calc non Af Amer >90  >90 mL/min   GFR calc Af Amer >90  >90 mL/min  URINALYSIS, ROUTINE W REFLEX MICROSCOPIC      Result Value Range   Color, Urine YELLOW  YELLOW   APPearance HAZY (*) CLEAR   Specific Gravity, Urine 1.020  1.005 - 1.030   pH 6.0  5.0 - 8.0   Glucose, UA NEGATIVE  NEGATIVE mg/dL   Hgb urine dipstick SMALL (*) NEGATIVE   Bilirubin Urine NEGATIVE  NEGATIVE   Ketones, ur NEGATIVE  NEGATIVE mg/dL   Protein, ur 30 (*) NEGATIVE mg/dL   Urobilinogen, UA 4.0 (*) 0.0 - 1.0 mg/dL   Nitrite POSITIVE (*) NEGATIVE   Leukocytes, UA SMALL (*) NEGATIVE  URINE MICROSCOPIC-ADD ON      Result Value Range   WBC, UA 21-50  <3 WBC/hpf   Bacteria, UA MANY (*) RARE  POCT PREGNANCY, URINE      Result Value Range   Preg Test, Ur NEGATIVE  NEGATIVE   Imaging Review Dg Chest 2 View  11/29/2012   CLINICAL DATA:  Left chest pain  EXAM: CHEST  2 VIEW  COMPARISON:  07/16/2009 chest CT. Next film  FINDINGS: The heart size and mediastinal contours are within  normal limits. Both lungs are clear. No effusion or pneumothorax. Pectus excavatum.  IMPRESSION: No active cardiopulmonary disease.    Electronically Signed   By: Tiburcio Pea   On: 11/29/2012 23:58    MDM   1. Acute bronchitis   2. Urinary tract infection    Left flank pain of uncertain cause. It seems to be most likely related to her persistent coughing. Cough appears to have some bronchospastic component to it and she will be given a therapeutic trial of albuterol. Chest x-ray will be rechecked. Old records are reviewed and she was seen on August 24 at which time urine grew 60,000 colonies of Escherichia coli which was sensitive to trimethoprim sulfamethoxazole which is what she was prescribed on discharge.  Chest x-ray shows no evidence of pneumonia. Repeat urinalysis shows UTI once again with positive nitrite and 21-50 WBCs and many bacteria. At this point, it is not clear whether this is a new infection or whether it was an inadequately treated initial infection. She will be discharged with a 10 day course of cephalexin and she is advised that her urine sample rechecked after completion of the antibiotic. She feels much better after albuterol and she is sent home with an albuterol inhaler. She's also placed on a course of prednisone. She is advised to stop smoking.  I personally performed the services described in this documentation, which was scribed in my presence. The recorded information has been reviewed and is accurate.     Dione Booze, MD 11/30/12 [phone]

## 2012-11-29 NOTE — ED Notes (Signed)
Pt states she is having the same symptoms as last time seen only worse. Pain to the lower abdomen & to left back.

## 2012-11-29 NOTE — ED Notes (Signed)
No answer

## 2012-11-29 NOTE — ED Notes (Signed)
abd pain, seen here 8/24 for same.nausea, no diarrhea

## 2012-11-30 LAB — CBC WITH DIFFERENTIAL/PLATELET
Basophils Relative: 0 % (ref 0–1)
Eosinophils Absolute: 0.3 10*3/uL (ref 0.0–0.7)
MCH: 30.6 pg (ref 26.0–34.0)
MCHC: 34.2 g/dL (ref 30.0–36.0)
Neutrophils Relative %: 78 % — ABNORMAL HIGH (ref 43–77)
Platelets: 195 10*3/uL (ref 150–400)
RBC: 3.76 MIL/uL — ABNORMAL LOW (ref 3.87–5.11)

## 2012-11-30 MED ORDER — CEPHALEXIN 500 MG PO CAPS
1000.0000 mg | ORAL_CAPSULE | Freq: Once | ORAL | Status: AC
  Administered 2012-11-30: 1000 mg via ORAL
  Filled 2012-11-30: qty 2

## 2012-11-30 MED ORDER — CEPHALEXIN 500 MG PO CAPS: 500.0000 mg | ORAL_CAPSULE | Freq: Four times a day (QID) | ORAL | Status: DC

## 2012-11-30 MED ORDER — PREDNISONE 50 MG PO TABS
60.0000 mg | ORAL_TABLET | Freq: Once | ORAL | Status: AC
  Administered 2012-11-30: 60 mg via ORAL
  Filled 2012-11-30: qty 1

## 2012-11-30 MED ORDER — PREDNISONE 20 MG PO TABS: 60.0000 mg | ORAL_TABLET | Freq: Every day | ORAL | Status: DC

## 2012-11-30 MED ORDER — ALBUTEROL SULFATE HFA 108 (90 BASE) MCG/ACT IN AERS
2.0000 | INHALATION_SPRAY | RESPIRATORY_TRACT | Status: DC | PRN
  Administered 2012-11-30: 2 via RESPIRATORY_TRACT
  Filled 2012-11-30: qty 6.7

## 2012-11-30 NOTE — ED Notes (Signed)
Pt alert & oriented x4, stable gait. Patient given discharge instructions, paperwork & prescription(s). Patient  instructed to stop at the registration desk to finish any additional paperwork. Patient verbalized understanding. Pt left department w/ no further questions. 

## 2012-12-02 LAB — URINE CULTURE

## 2012-12-07 ENCOUNTER — Encounter (HOSPITAL_COMMUNITY): Payer: Self-pay

## 2012-12-07 ENCOUNTER — Emergency Department (HOSPITAL_COMMUNITY)
Admission: EM | Admit: 2012-12-07 | Discharge: 2012-12-07 | Disposition: A | Discharge status: Home / Self Care | Payer: Self-pay | Attending: Emergency Medicine | Admitting: Emergency Medicine

## 2012-12-07 DIAGNOSIS — H9190 Unspecified hearing loss, unspecified ear: Secondary | ICD-10-CM | POA: Insufficient documentation

## 2012-12-07 DIAGNOSIS — Z792 Long term (current) use of antibiotics: Secondary | ICD-10-CM | POA: Insufficient documentation

## 2012-12-07 DIAGNOSIS — J40 Bronchitis, not specified as acute or chronic: Secondary | ICD-10-CM | POA: Insufficient documentation

## 2012-12-07 DIAGNOSIS — R0982 Postnasal drip: Secondary | ICD-10-CM | POA: Insufficient documentation

## 2012-12-07 DIAGNOSIS — F17200 Nicotine dependence, unspecified, uncomplicated: Secondary | ICD-10-CM | POA: Insufficient documentation

## 2012-12-07 DIAGNOSIS — N390 Urinary tract infection, site not specified: Secondary | ICD-10-CM | POA: Insufficient documentation

## 2012-12-07 DIAGNOSIS — R110 Nausea: Secondary | ICD-10-CM | POA: Insufficient documentation

## 2012-12-07 DIAGNOSIS — R509 Fever, unspecified: Secondary | ICD-10-CM | POA: Insufficient documentation

## 2012-12-07 MED ORDER — IPRATROPIUM BROMIDE 0.02 % IN SOLN
0.5000 mg | Freq: Once | RESPIRATORY_TRACT | Status: DC
  Filled 2012-12-07: qty 2.5

## 2012-12-07 MED ORDER — LORATADINE 10 MG PO TABS: 10.0000 mg | ORAL_TABLET | Freq: Every day | ORAL | Status: DC

## 2012-12-07 MED ORDER — ALBUTEROL SULFATE (5 MG/ML) 0.5% IN NEBU
5.0000 mg | INHALATION_SOLUTION | Freq: Once | RESPIRATORY_TRACT | Status: DC
  Filled 2012-12-07: qty 1

## 2012-12-07 MED ORDER — IPRATROPIUM BROMIDE 0.02 % IN SOLN
0.5000 mg | Freq: Once | RESPIRATORY_TRACT | Status: AC
  Administered 2012-12-07: 0.5 mg via RESPIRATORY_TRACT

## 2012-12-07 MED ORDER — BENZONATATE 100 MG PO CAPS: 100.0000 mg | ORAL_CAPSULE | Freq: Two times a day (BID) | ORAL | Status: DC | PRN

## 2012-12-07 MED ORDER — ONDANSETRON HCL 4 MG PO TABS
4.0000 mg | ORAL_TABLET | Freq: Once | ORAL | Status: AC
  Administered 2012-12-07: 4 mg via ORAL
  Filled 2012-12-07: qty 1

## 2012-12-07 MED ORDER — ALBUTEROL SULFATE HFA 108 (90 BASE) MCG/ACT IN AERS
2.0000 | INHALATION_SPRAY | Freq: Once | RESPIRATORY_TRACT | Status: AC
  Administered 2012-12-07: 2 via RESPIRATORY_TRACT
  Filled 2012-12-07: qty 6.7

## 2012-12-07 MED ORDER — ALBUTEROL SULFATE (5 MG/ML) 0.5% IN NEBU
2.5000 mg | INHALATION_SOLUTION | Freq: Once | RESPIRATORY_TRACT | Status: AC
  Administered 2012-12-07: 2.5 mg via RESPIRATORY_TRACT

## 2012-12-07 NOTE — ED Notes (Signed)
Patient with no complaints at this time. Respirations even and unlabored. Skin warm/dry. Discharge instructions reviewed with patient at this time. Patient given opportunity to voice concerns/ask questions. Patient discharged at this time and left Emergency Department with steady gait.   

## 2012-12-07 NOTE — ED Provider Notes (Signed)
CSN: [id number]     Arrival date & time 12/07/12  1344 History  This chart was scribed for Raeford Razor, MD by Bennett Scrape, ED Scribe. This patient was seen in room APA07/APA07 and the patient's care was started at 3:22 PM.   Chief Complaint  Patient presents with  . Bronchitis  . Urinary Tract Infection    The history is provided by the patient. No language interpreter was used.    HPI Comments: Rita Baldwin is a 27 y.o. female who presents to the Emergency Department complaining of persistent NP cough with associated SOB, post-nasal drip, nausea and decreased hearing. She describes the SOB as being unable to take a deep breath and decreased hearing as being unable to hear herself. She reports intermittent fevers mainly at night but denies any leg swelling, CP and emesis. She was seen for the same on the 16th and received a breathing treatment in the with improvement. CXR done at the time was negative. She was discharged with prednisone, last dose was this morning, and an albuterol inhaler with mild improvement. She is here today because she just started a new job and wants to get better so that she can return.  She also c/o nausea from the Keflex. She states that she was also seen on 9/16 for "UTI" type symptoms. She reports that the abdominal pain has resolved but the antibiotic, Keflex, she was prescribed has made her nauseated. 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 She states that she keeps "burping up" the antibiotic and her boss was concerned making her leave work early.    Past Medical History  Diagnosis Date  . Drug abuse   . Overdose    Past Surgical History  Procedure Laterality Date  . Multiple tooth extractions     No family history on file. History  Substance Use Topics  . Smoking status: Current  Every Day Smoker -- 1.00 packs/day for 9 years  . Smokeless tobacco: Not on file  . Alcohol Use: No   No OB history provided.  Review of Systems  Constitutional: Positive for fever (intermittent).  HENT: Positive for hearing loss.   Respiratory: Positive for cough and shortness of breath. Negative for wheezing.   Cardiovascular: Negative for chest pain and leg swelling.  Gastrointestinal: Positive for nausea. Negative for vomiting and abdominal pain.  All other systems reviewed and are negative.    Allergies  Review of patient's allergies indicates no known allergies.  Home Medications   Current Outpatient Rx  Name  Route  Sig  Dispense  Refill  . cephALEXin (KEFLEX) 500 MG capsule   Oral   Take 1 capsule (500 mg total) by mouth 4 (four) times daily.   40 capsule   0   . chlordiazePOXIDE (LIBRIUM) 25 MG capsule   Oral   Take 25 mg by mouth 3 (three) times daily as needed for anxiety.         . cloNIDine (CATAPRES) 0.1 MG tablet   Oral   Take 0.1 mg by mouth 3 (three) times daily as needed. sleep         . levonorgestrel-ethinyl estradiol (SEASONALE) 0.15-0.03 MG tablet   Oral   Take 1 tablet by mouth daily.   1 Package   4     For contraception   . predniSONE (  DELTASONE) 20 MG tablet   Oral   Take 3 tablets (60 mg total) by mouth daily.   15 tablet   0    Triage Vitals: BP 131/73  Pulse 87  Temp(Src) 98.2 F (36.8 C) (Oral)  Resp 18  Ht 5\' 1"  (1.549 m)  Wt 108 lb (48.988 kg)  BMI 20.42 kg/m2  SpO2 100%  Physical Exam  Nursing note and vitals reviewed. Constitutional: She is oriented to person, place, and time. She appears well-developed and well-nourished. No distress.  HENT:  Head: Normocephalic and atraumatic.  Eyes: EOM are normal.  Neck: Neck supple. No tracheal deviation present.  Cardiovascular: Normal rate and regular rhythm.   Pulmonary/Chest: Effort normal and breath sounds normal. No respiratory distress.  Abdominal: Soft. There is  no tenderness.  Musculoskeletal: Normal range of motion. She exhibits no edema (no calf edema) and no tenderness (no calf tenderness ).  Neurological: She is alert and oriented to person, place, and time.  Skin: Skin is warm and dry.  Psychiatric: She has a normal mood and affect. Her behavior is normal.    ED Course  Procedures (including critical care time)  DIAGNOSTIC STUDIES: Oxygen Saturation is 100% on room air, normal by my interpretation.    COORDINATION OF CARE: 3:31 PM-Discussed smoking cessation with pt. Discussed discharge plan which includes breathing treatment in the ED and prescriptions for cough and decongestants with pt and pt agreed to plan. Aslo advised pt that rest and drink plenty of fluids. Also advised pt to follow up as needed and pt agreed. Addressed symptoms to return for with pt.   Labs Review Labs Reviewed - No data to display Imaging Review No results found.  MDM   1. Bronchitis    27yF with likely bronchitis/viral illness. Discussed typical course. No respiratory distress. Plan continued symptomatic tx.   I personally preformed the services scribed in my presence. The recorded information has been reviewed is accurate. Raeford Razor, MD.     Raeford Razor, MD 12/14/12 [phone]

## 2012-12-07 NOTE — ED Notes (Signed)
Patient states she was seen recently for bronchitis, but feels it isn't getting any better. Still coughing frequently, non-productive.  C/O increasing nasal congestion.  States she is on last day of steroids for bronchitis and Keflex for UTI.  Wants to know if she can have another INH as this helped, but she has no insurance and cannot afford to get rx filled for this.

## 2012-12-07 NOTE — ED Notes (Addendum)
Pt reports was diagnosed with bronchitis and UTI 10days ago.  Pt says still feels like has bronchitis.  Pt says feels like UTI seems to be getting better but still coughing.  Pt also c/o n/v.  Pt says thinks is from the keflex.

## 2012-12-11 ENCOUNTER — Encounter (HOSPITAL_COMMUNITY): Payer: Self-pay | Admitting: Emergency Medicine

## 2012-12-11 ENCOUNTER — Emergency Department (HOSPITAL_COMMUNITY)
Admission: EM | Admit: 2012-12-11 | Discharge: 2012-12-11 | Disposition: A | Discharge status: Home / Self Care | Payer: Self-pay | Attending: Emergency Medicine | Admitting: Emergency Medicine

## 2012-12-11 ENCOUNTER — Emergency Department (HOSPITAL_COMMUNITY): Payer: Self-pay

## 2012-12-11 DIAGNOSIS — Z3202 Encounter for pregnancy test, result negative: Secondary | ICD-10-CM | POA: Insufficient documentation

## 2012-12-11 DIAGNOSIS — R059 Cough, unspecified: Secondary | ICD-10-CM | POA: Insufficient documentation

## 2012-12-11 DIAGNOSIS — K921 Melena: Secondary | ICD-10-CM | POA: Insufficient documentation

## 2012-12-11 DIAGNOSIS — Z792 Long term (current) use of antibiotics: Secondary | ICD-10-CM | POA: Insufficient documentation

## 2012-12-11 DIAGNOSIS — R1032 Left lower quadrant pain: Secondary | ICD-10-CM | POA: Insufficient documentation

## 2012-12-11 DIAGNOSIS — K920 Hematemesis: Secondary | ICD-10-CM | POA: Insufficient documentation

## 2012-12-11 DIAGNOSIS — R05 Cough: Secondary | ICD-10-CM | POA: Insufficient documentation

## 2012-12-11 DIAGNOSIS — F17200 Nicotine dependence, unspecified, uncomplicated: Secondary | ICD-10-CM | POA: Insufficient documentation

## 2012-12-11 DIAGNOSIS — IMO0002 Reserved for concepts with insufficient information to code with codable children: Secondary | ICD-10-CM | POA: Insufficient documentation

## 2012-12-11 DIAGNOSIS — R109 Unspecified abdominal pain: Secondary | ICD-10-CM

## 2012-12-11 DIAGNOSIS — Z79899 Other long term (current) drug therapy: Secondary | ICD-10-CM | POA: Insufficient documentation

## 2012-12-11 DIAGNOSIS — Z8709 Personal history of other diseases of the respiratory system: Secondary | ICD-10-CM | POA: Insufficient documentation

## 2012-12-11 DIAGNOSIS — Z87440 Personal history of urinary (tract) infections: Secondary | ICD-10-CM | POA: Insufficient documentation

## 2012-12-11 HISTORY — DX: Urinary tract infection, site not specified: N39.0

## 2012-12-11 HISTORY — DX: Bronchitis, not specified as acute or chronic: J40

## 2012-12-11 LAB — URINALYSIS, ROUTINE W REFLEX MICROSCOPIC
Bilirubin Urine: NEGATIVE
Leukocytes, UA: NEGATIVE
Nitrite: NEGATIVE
Protein, ur: NEGATIVE mg/dL
Specific Gravity, Urine: 1.02 (ref 1.005–1.030)
Urobilinogen, UA: 0.2 mg/dL (ref 0.0–1.0)
pH: 7 (ref 5.0–8.0)

## 2012-12-11 LAB — CBC WITH DIFFERENTIAL/PLATELET
Basophils Absolute: 0 10*3/uL (ref 0.0–0.1)
Basophils Relative: 0 % (ref 0–1)
Eosinophils Absolute: 0.3 10*3/uL (ref 0.0–0.7)
Eosinophils Relative: 2 % (ref 0–5)
HCT: 39.6 % (ref 36.0–46.0)
Hemoglobin: 13.4 g/dL (ref 12.0–15.0)
MCH: 30.2 pg (ref 26.0–34.0)
MCHC: 33.8 g/dL (ref 30.0–36.0)
Monocytes Relative: 4 % (ref 3–12)
Neutro Abs: 11.9 10*3/uL — ABNORMAL HIGH (ref 1.7–7.7)
Neutrophils Relative %: 81 % — ABNORMAL HIGH (ref 43–77)
Platelets: 377 10*3/uL (ref 150–400)
RDW: 14.2 % (ref 11.5–15.5)

## 2012-12-11 LAB — BASIC METABOLIC PANEL
BUN: 12 mg/dL (ref 6–23)
Chloride: 98 mEq/L (ref 96–112)
GFR calc Af Amer: >90 mL/min (ref >90)
GFR calc non Af Amer: >90 mL/min (ref >90)
Potassium: 3.5 mEq/L (ref 3.5–5.1)
Sodium: 132 mEq/L — ABNORMAL LOW (ref 135–145)

## 2012-12-11 LAB — WET PREP, GENITAL: Yeast Wet Prep HPF POC: NONE SEEN

## 2012-12-11 MED ORDER — IOHEXOL 300 MG/ML  SOLN
50.0000 mL | Freq: Once | INTRAMUSCULAR | Status: AC | PRN
  Administered 2012-12-11: 50 mL via ORAL

## 2012-12-11 MED ORDER — LORAZEPAM 1 MG PO TABS
1.0000 mg | ORAL_TABLET | Freq: Once | ORAL | Status: AC
  Administered 2012-12-11: 1 mg via ORAL
  Filled 2012-12-11: qty 1

## 2012-12-11 MED ORDER — IOHEXOL 300 MG/ML  SOLN
100.0000 mL | Freq: Once | INTRAMUSCULAR | Status: AC | PRN
  Administered 2012-12-11: 100 mL via INTRAVENOUS

## 2012-12-11 NOTE — ED Provider Notes (Signed)
CSN: [id number]     Arrival date & time 12/11/12  1202 History   First MD Initiated Contact with Patient 12/11/12 1254     Chief Complaint  Patient presents with  . Rectal Bleeding  . Hematemesis   (Consider location/radiation/quality/duration/timing/severity/associated sxs/prior Treatment) HPI This is a 27 year old female who presents with left lower quadrant pain, hematemesis, and bright red blood per rectum.  Patient states that she has had ongoing problems for the last 6 weeks. She was seen approximately 6 weeks ago with left lower quadrant pain and given Bactrim for urinary tract infection. She subsequently was seen again and given Keflex for recurrent UTI. Patient states that she's gotten better but then the left lower quadrant pain comes back. The pain is nonradiating. It is dull. She rates it a 4/10.  Patient states that 2 days ago she had one episode of emesis with a "small amount of blood in it." She states that she felt that this was because she been coughing and her throat was dry. Patient had recurrence of this this morning. She still describes as "a small amount of blood." Patient had a normal bowel movement this morning where she noted bright red blood. She states that it was "enough to soak a tampon." It scared her and that is why she is presenting today. She denies any fevers, headache, chest pain, shortness of breath, urinary symptoms, fullness or numbness. Past Medical History  Diagnosis Date  . Drug abuse   . Overdose   . UTI (lower urinary tract infection)   . Bronchitis    Past Surgical History  Procedure Laterality Date  . Multiple tooth extractions     History reviewed. No pertinent family history. History  Substance Use Topics  . Smoking status: Current Every Day Smoker -- 1.00 packs/day for 9 years  . Smokeless tobacco: Not on file  . Alcohol Use: Yes     Comment: occ   OB History   Grav Para Term Preterm Abortions TAB SAB Ect Mult Living                  Review of Systems  Constitutional: Negative for fever.  Respiratory: Positive for cough. Negative for chest tightness and shortness of breath.   Cardiovascular: Negative for chest pain.  Gastrointestinal: Positive for vomiting, abdominal pain and blood in stool. Negative for nausea and rectal pain.  Genitourinary: Negative for dysuria.  Musculoskeletal: Negative for back pain.  Skin: Negative for wound.  Neurological: Negative for dizziness and syncope.  Psychiatric/Behavioral: Negative for confusion.  All other systems reviewed and are negative.    Allergies  Review of patient's allergies indicates no known allergies.  Home Medications   Current Outpatient Rx  Name  Route  Sig  Dispense  Refill  . chlordiazePOXIDE (LIBRIUM) 25 MG capsule   Oral   Take 25 mg by mouth 3 (three) times daily as needed for anxiety.         . cloNIDine (CATAPRES) 0.1 MG tablet   Oral   Take 0.1 mg by mouth 3 (three) times daily as needed. sleep         . predniSONE (DELTASONE) 20 MG tablet   Oral   Take 3 tablets (60 mg total) by mouth daily.   15 tablet   0   . PRESCRIPTION MEDICATION   Oral   Take 1 tablet by mouth daily. Birth Control **Jalissa*         . cephALEXin (KEFLEX) 500 MG capsule  Oral   Take 1 capsule (500 mg total) by mouth 4 (four) times daily.   40 capsule   0    BP 113/72  Pulse 83  Temp(Src) 98.3 F (36.8 C) (Oral)  Resp 18  SpO2 98%  LMP 11/07/2012 Physical Exam  Nursing note and vitals reviewed. Constitutional: She is oriented to person, place, and time. She appears well-developed and well-nourished.  HENT:  Head: Normocephalic and atraumatic.  Eyes: Pupils are equal, round, and reactive to light.  Neck: Neck supple.  Cardiovascular: Regular rhythm and normal heart sounds.   tachycardia  Pulmonary/Chest: Effort normal and breath sounds normal. No respiratory distress. She has no wheezes.  Abdominal: Soft. Bowel sounds are normal. She exhibits  no distension. There is no tenderness. There is no rebound.  Genitourinary:  Rectal exam, normal rectal tone, no gross blood  Neurological: She is alert and oriented to person, place, and time.  Skin: Skin is warm and dry.  Psychiatric: She has a normal mood and affect.    ED Course  Procedures (including critical care time)  Angiocath insertion Performed by: Ross Marcus, F  Consent: Verbal consent obtained. Risks and benefits: risks, benefits and alternatives were discussed Time out: Immediately prior to procedure a "time out" was called to verify the correct patient, procedure, equipment, support staff and site/side marked as required.  Preparation: Patient was prepped and draped in the usual sterile fashion.  Vein Location: right basilic  Ultrasound Guided  Gauge: 20  Normal blood return and flush without difficulty Patient tolerance: Patient tolerated the procedure well with no immediate complications.    Labs Review Labs Reviewed  CBC WITH DIFFERENTIAL - Abnormal; Notable for the following:    WBC 14.8 (*)    Neutrophils Relative % 81 (*)    Neutro Abs 11.9 (*)    All other components within normal limits  BASIC METABOLIC PANEL - Abnormal; Notable for the following:    Sodium 132 (*)    Glucose, Bld 111 (*)    All other components within normal limits  URINALYSIS, ROUTINE W REFLEX MICROSCOPIC  POCT PREGNANCY, URINE   Hemoccult negative  Imaging Review Ct Abdomen Pelvis W Contrast  12/11/2012   CLINICAL DATA:  Rectal bleeding, hematemesis today, left lower quadrant pain  EXAM: CT ABDOMEN AND PELVIS WITH CONTRAST  TECHNIQUE: Multidetector CT imaging of the abdomen and pelvis was performed using the standard protocol following bolus administration of intravenous contrast. Sagittal and coronal MPR images reconstructed from axial data set.  CONTRAST:  OMNIPAQUE IOHEXOL 300 MG/ML SOLN, 50mL OMNIPAQUE IOHEXOL 300 MG/ML SOLN  COMPARISON:  None  FINDINGS: Lung  bases clear.  Pectus excavatum.  Small bilateral renal cysts.  Liver, spleen, pancreas, kidneys, and adrenal glands otherwise normal.  Normal appendix.  Stomach and bowel loops normal in appearance.  Unremarkable bladder, ureters, uterus and adnexae.  No mass, adenopathy, free fluid or inflammatory process.  No hernia or acute bone lesion.  IMPRESSION: No acute intra-abdominal or intrapelvic abnormalities.   Electronically Signed   By: Ulyses Southward M.D.   On: 12/11/2012 16:44    MDM   1. Abdominal pain   2. Hematochezia     This is a 27 year old female who presents with persistent left lower quadrant pain and rectal bleeding. She is nontoxic-appearing on exam. Vital signs are notable for heart rate of 105. Exam is relatively benign. Pelvic exam is unremarkable. Hemoccult is negative. Patient does have a mild elevation in her white count. Given  recent bloody stools and persistent left lower quadrant pain. Patient will do CT scan of abdomen to rule out colitis or inflammatory bowel.  CT scan is negative. At this time I'm unsure of the ED on the the patient's current symptoms. She has been nontoxic while the ER and has had no recurrent episodes of bleeding. Her abdominal exam remains benign. Patient will followup with her primary care doctor for referral to a GI specialist as this may be the next workup if she continues to have pain. Patient stated understanding.  After history, exam, and medical workup I feel the patient has been appropriately medically screened and is safe for discharge home. Pertinent diagnoses were discussed with the patient. Patient was given return precautions.     Shon Baton, MD 12/11/12 [phone]

## 2012-12-11 NOTE — ED Notes (Signed)
Vomited x 2 days ago with "small " amount of blood. Had rectal bleeding this am with bright red blood x 1. Mm wet. Color wnl. Nad.

## 2012-12-13 LAB — GC/CHLAMYDIA PROBE AMP: CT Probe RNA: NEGATIVE

## 2012-12-24 ENCOUNTER — Emergency Department (HOSPITAL_COMMUNITY): Admission: EM | Admit: 2012-12-24 | Discharge: 2012-12-24 | Discharge status: Against Medical Advice | Payer: Self-pay

## 2013-03-17 DIAGNOSIS — I10 Essential (primary) hypertension: Secondary | ICD-10-CM

## 2013-03-17 HISTORY — DX: Essential (primary) hypertension: I10

## 2013-03-21 ENCOUNTER — Encounter (HOSPITAL_COMMUNITY): Payer: Self-pay | Admitting: Emergency Medicine

## 2013-03-21 ENCOUNTER — Emergency Department (HOSPITAL_COMMUNITY): Payer: Self-pay

## 2013-03-21 ENCOUNTER — Emergency Department (HOSPITAL_COMMUNITY)
Admission: EM | Admit: 2013-03-21 | Discharge: 2013-03-21 | Disposition: A | Discharge status: Home / Self Care | Payer: Self-pay | Attending: Emergency Medicine | Admitting: Emergency Medicine

## 2013-03-21 DIAGNOSIS — Y92410 Unspecified street and highway as the place of occurrence of the external cause: Secondary | ICD-10-CM | POA: Insufficient documentation

## 2013-03-21 DIAGNOSIS — S6990XA Unspecified injury of unspecified wrist, hand and finger(s), initial encounter: Secondary | ICD-10-CM | POA: Insufficient documentation

## 2013-03-21 DIAGNOSIS — S59919A Unspecified injury of unspecified forearm, initial encounter: Secondary | ICD-10-CM

## 2013-03-21 DIAGNOSIS — IMO0002 Reserved for concepts with insufficient information to code with codable children: Secondary | ICD-10-CM | POA: Insufficient documentation

## 2013-03-21 DIAGNOSIS — S60229A Contusion of unspecified hand, initial encounter: Secondary | ICD-10-CM | POA: Insufficient documentation

## 2013-03-21 DIAGNOSIS — Z8709 Personal history of other diseases of the respiratory system: Secondary | ICD-10-CM | POA: Insufficient documentation

## 2013-03-21 DIAGNOSIS — W64XXXA Exposure to other animate mechanical forces, initial encounter: Secondary | ICD-10-CM | POA: Insufficient documentation

## 2013-03-21 DIAGNOSIS — S60222A Contusion of left hand, initial encounter: Secondary | ICD-10-CM

## 2013-03-21 DIAGNOSIS — Y9289 Other specified places as the place of occurrence of the external cause: Secondary | ICD-10-CM | POA: Insufficient documentation

## 2013-03-21 DIAGNOSIS — S61409A Unspecified open wound of unspecified hand, initial encounter: Secondary | ICD-10-CM | POA: Insufficient documentation

## 2013-03-21 DIAGNOSIS — Y9389 Activity, other specified: Secondary | ICD-10-CM | POA: Insufficient documentation

## 2013-03-21 DIAGNOSIS — F17200 Nicotine dependence, unspecified, uncomplicated: Secondary | ICD-10-CM | POA: Insufficient documentation

## 2013-03-21 DIAGNOSIS — S61412A Laceration without foreign body of left hand, initial encounter: Secondary | ICD-10-CM

## 2013-03-21 DIAGNOSIS — Z87440 Personal history of urinary (tract) infections: Secondary | ICD-10-CM | POA: Insufficient documentation

## 2013-03-21 DIAGNOSIS — Z79899 Other long term (current) drug therapy: Secondary | ICD-10-CM | POA: Insufficient documentation

## 2013-03-21 DIAGNOSIS — S59909A Unspecified injury of unspecified elbow, initial encounter: Secondary | ICD-10-CM | POA: Insufficient documentation

## 2013-03-21 MED ORDER — LIDOCAINE VISCOUS 2 % MT SOLN
15.0000 mL | Freq: Once | OROMUCOSAL | Status: AC
  Administered 2013-03-21: 15 mL via OROMUCOSAL
  Filled 2013-03-21: qty 15

## 2013-03-21 MED ORDER — LIDOCAINE HCL (PF) 1 % IJ SOLN
INTRAMUSCULAR | Status: AC
  Filled 2013-03-21: qty 5

## 2013-03-21 MED ORDER — IBUPROFEN 800 MG PO TABS
800.0000 mg | ORAL_TABLET | Freq: Once | ORAL | Status: AC
  Administered 2013-03-21: 800 mg via ORAL
  Filled 2013-03-21: qty 1

## 2013-03-21 MED ORDER — LIDOCAINE HCL (PF) 1 % IJ SOLN
5.0000 mL | Freq: Once | INTRAMUSCULAR | Status: AC
  Administered 2013-03-21: 5 mL via INTRADERMAL

## 2013-03-21 NOTE — ED Provider Notes (Signed)
CSN: [id number]     Arrival date & time 03/21/13  0932 History   First MD Initiated Contact with Patient 03/21/13 1045     Chief Complaint  Patient presents with  . Laceration  . Hand Pain  . Wrist Pain   (Consider location/radiation/quality/duration/timing/severity/associated sxs/prior Treatment) Patient is a 28 y.o. female presenting with skin laceration. The history is provided by the patient.  Laceration Location:  Hand Hand laceration location:  L hand Length (cm):  2 Depth:  Cutaneous Quality: jagged   Bleeding: controlled   Time since incident: just PTA. Laceration mechanism:  Blunt object Pain details:    Quality:  Aching and throbbing   Severity:  Moderate   Timing:  Constant   Progression:  Unchanged Foreign body present:  No foreign bodies Relieved by:  Nothing Worsened by:  Movement and pressure Ineffective treatments:  None tried Tetanus status:  Up to date   Patient also c/o pain to her left dorsal hand and wrist.  Pain worse with movement.  She cleaned the wound initially but has not taken any medications PTA.  She denies numbness or weakness of the hand or wrist.  Patient reports hx of narcotic abuse and requests that no narcotics be prescribed to her.  Past Medical History  Diagnosis Date  . Drug abuse   . UTI (lower urinary tract infection)   . Bronchitis    Past Surgical History  Procedure Laterality Date  . Multiple tooth extractions     History reviewed. No pertinent family history. History  Substance Use Topics  . Smoking status: Current Every Day Smoker -- 0.50 packs/day for 9 years    Types: Cigarettes  . Smokeless tobacco: Never Used  . Alcohol Use: No   OB History   Grav Para Term Preterm Abortions TAB SAB Ect Mult Living            0     Review of Systems  Constitutional: Negative for fever and chills.  Genitourinary: Negative for dysuria and difficulty urinating.  Musculoskeletal: Positive for arthralgias and joint swelling.    Skin: Positive for wound. Negative for color change.  Hematological: Does not bruise/bleed easily.  All other systems reviewed and are negative.    Allergies  Review of patient's allergies indicates no known allergies.  Home Medications   Current Outpatient Rx  Name  Route  Sig  Dispense  Refill  . chlordiazePOXIDE (LIBRIUM) 25 MG capsule   Oral   Take 25 mg by mouth 3 (three) times daily as needed for anxiety.         . cloNIDine (CATAPRES) 0.1 MG tablet   Oral   Take 0.1 mg by mouth 3 (three) times daily as needed. sleep         . PRESCRIPTION MEDICATION   Oral   Take 1 tablet by mouth daily. Birth Control **Jalissa*          BP 125/97  Pulse 85  Temp(Src) 98.3 F (36.8 C) (Oral)  Resp 16  Ht 5\' 1"  (1.549 m)  Wt 105 lb (47.628 kg)  BMI 19.85 kg/m2  SpO2 100% Physical Exam  Nursing note and vitals reviewed. Constitutional: She is oriented to person, place, and time. She appears well-developed and well-nourished. No distress.  HENT:  Head: Normocephalic and atraumatic.  Cardiovascular: Normal rate, regular rhythm, normal heart sounds and intact distal pulses.   No murmur heard. Pulmonary/Chest: Effort normal and breath sounds normal. No respiratory distress.  Musculoskeletal: She exhibits edema  and tenderness.       Left hand: She exhibits tenderness, laceration and swelling. She exhibits normal range of motion, no bony tenderness, normal two-point discrimination, normal capillary refill and no deformity. Normal sensation noted. Normal strength noted.       Hands: 2 cm laceration dorsal left hand, bleeding controlled. Lac does not extend to the deep structures on exam.  ttp of the distal wrist.  Radial pulse is brisk, distal sensation intact.  CR< 2 sec.  Mild STS without bony deformity.  Patient has full ROM. Compartments soft.  Neurological: She is alert and oriented to person, place, and time. She exhibits normal muscle tone. Coordination normal.  Skin: Skin  is warm and dry.    ED Course  Procedures (including critical care time) Labs Review Labs Reviewed - No data to display Imaging Review Dg Hand Complete Left  03/21/2013   CLINICAL DATA:  Laceration to the hand.  EXAM: LEFT HAND - COMPLETE 3+ VIEW  COMPARISON:  04/18/2012.  FINDINGS: Multiple views of the left hand demonstrate no acute displaced fracture, subluxation, dislocation, or soft tissue abnormality. Additionally, there is no retained radiopaque foreign body within the soft tissues of the hand.  IMPRESSION: No acute radiographic abnormality of the left hand.   Electronically Signed   By: Vinnie Langton M.D.   On: 03/21/2013 11:34    EKG Interpretation   None       MDM    LACERATION REPAIR Performed by: Tazaria Dlugosz L. Authorized by: Hale Bogus Consent: Verbal consent obtained. Risks and benefits: risks, benefits and alternatives were discussed Consent given by: patient Patient identity confirmed: provided demographic data Prepped and Draped in normal sterile fashion Wound explored  Laceration Location: dorsal left hand Laceration Length: 2 cm  No Foreign Bodies seen or palpated  Anesthesia: local infiltration  Local anesthetic: lidocaine 1 % w/o epinephrine  Anesthetic total: 2 ml  Irrigation method: syringe Amount of cleaning: standard  Skin closure: steri-strips Number of steri-strips: 3  Patient tolerance: Patient tolerated the procedure well with no immediate complications.    Td is UTD  Wound(s) explored with adequate hemostasis through ROM, no apparent gross foreign body retained, no significant involvement of deep structures such as bone / joint / tendon / or neurovascular involvement noted.  Baseline Strength and Sensation to affected extremity(ies) with normal light touch for Pt, distal NVI with CR< 2 secs and pulse(s) intact to affected extremity(ies).   Wound bandaged, wrist splint applied.  Pain improved, remains NV intact.  Pt  agrees to RICE therapy and orthopedic f/u if needed.  Referral given.  She appears stable for d/c  Kamar Callender L. Damaria Vachon, PA-C 03/22/13 1304

## 2013-03-21 NOTE — ED Notes (Addendum)
Patient c/o left hand pain with laceration. Per patient had dog on leash walking beside card and was driving slowly around corner to driveway. Per patient dog jerked hurting left wrist. Per patient laceration to top of left hand. Patient states that her hand was pulled into plastic lock on car door in which broke off. Per patient hand bleed for 25 minutes. Patient reports pain is radiating into index finger and ring fiinger. Patient states she does not want narcotics offered or ordered for any reason.

## 2013-03-21 NOTE — Discharge Instructions (Signed)
Hand Contusion  A hand contusion is a deep bruise to the hand. Contusions happen when an injury causes bleeding under the skin. Signs of bruising include pain, puffiness (swelling), and discolored skin. The contusion may turn blue, purple, or yellow. HOME CARE  Put ice on the injured area.  Put ice in a plastic bag.  Place a towel between your skin and the bag.  Leave the ice on for 15-20 minutes, 03-04 times a day.  Only take medicines as told by your doctor.  Use an elastic wrap only as told. You may remove the wrap for sleeping, showering, and bathing. Take the wrap off if you lose feeling (have numbness) in your fingers, or they turn blue or cold. Put the wrap on more loosely.  Keep the hand raised (elevated) with pillows.  Avoid using your hand too much if it painful. GET HELP RIGHT AWAY IF:   You have more redness, puffiness, or pain in your hand.  Your puffiness or pain does not get better with medicine.  You lose feeling in your hand, or you cannot move your fingers.  Your hand turns cold or blue.  You have pain when you move your fingers.  Your hand feels warm.  Your contusion does not get better in 2 days. MAKE SURE YOU:   Understand these instructions.  Will watch this condition.  Will get help right away if you are not doing well or you get worse. Document Released: 08/20/2007 Document Revised: 11/26/2011 Document Reviewed: 08/25/2011 [facility] Patient Information 2014 [place].  Stitches, Staples, or Skin Adhesive Strips  Stitches (sutures), staples, and skin adhesive strips hold the skin together as it heals. They will usually be in place for 7 days or less. HOME CARE  Wash your hands with soap and water before and after you touch your wound.  Only take medicine as told by your doctor.  Cover your wound only if your doctor told you to. Otherwise, leave it open to air.  Do not get your stitches wet or dirty. If they get dirty, dab them gently  with a clean washcloth. Wet the washcloth with soapy water. Do not rub. Pat them dry gently.  Do not put medicine or medicated cream on your stitches unless your doctor told you to.  Do not take out your own stitches or staples. Skin adhesive strips will fall off by themselves.  Do not pick at the wound. Picking can cause an infection.  Do not miss your follow-up appointment.  If you have problems or questions, call your doctor. GET HELP RIGHT AWAY IF:   You have a temperature by mouth above 102 F (38.9 C), not controlled by medicine.  You have chills.  You have redness or pain around your stitches.  There is puffiness (swelling) around your stitches.  You notice fluid (drainage) from your stitches.  There is a bad smell coming from your wound. MAKE SURE YOU:  Understand these instructions.  Will watch your condition.  Will get help if you are not doing well or get worse. Document Released: 12/29/2008 Document Revised: 05/26/2011 Document Reviewed: 12/29/2008 [facility] Patient Information 2014 [place], [place].

## 2013-03-22 NOTE — ED Provider Notes (Signed)
Medical screening examination/treatment/procedure(s) were performed by non-physician practitioner and as supervising physician I was immediately available for consultation/collaboration.  EKG Interpretation   None         Maudry Diego, MD 03/22/13 1615

## 2013-05-27 ENCOUNTER — Emergency Department (HOSPITAL_COMMUNITY): Payer: Self-pay

## 2013-05-27 ENCOUNTER — Emergency Department (HOSPITAL_COMMUNITY)
Admission: EM | Admit: 2013-05-27 | Discharge: 2013-05-27 | Disposition: A | Discharge status: Home / Self Care | Payer: Self-pay | Attending: Emergency Medicine | Admitting: Emergency Medicine

## 2013-05-27 ENCOUNTER — Encounter (HOSPITAL_COMMUNITY): Payer: Self-pay | Admitting: Emergency Medicine

## 2013-05-27 DIAGNOSIS — S6990XA Unspecified injury of unspecified wrist, hand and finger(s), initial encounter: Secondary | ICD-10-CM | POA: Insufficient documentation

## 2013-05-27 DIAGNOSIS — S0190XA Unspecified open wound of unspecified part of head, initial encounter: Secondary | ICD-10-CM | POA: Insufficient documentation

## 2013-05-27 DIAGNOSIS — Z79899 Other long term (current) drug therapy: Secondary | ICD-10-CM | POA: Insufficient documentation

## 2013-05-27 DIAGNOSIS — IMO0002 Reserved for concepts with insufficient information to code with codable children: Secondary | ICD-10-CM | POA: Insufficient documentation

## 2013-05-27 DIAGNOSIS — S022XXA Fracture of nasal bones, initial encounter for closed fracture: Secondary | ICD-10-CM | POA: Insufficient documentation

## 2013-05-27 DIAGNOSIS — S59909A Unspecified injury of unspecified elbow, initial encounter: Secondary | ICD-10-CM | POA: Insufficient documentation

## 2013-05-27 DIAGNOSIS — F410 Panic disorder [episodic paroxysmal anxiety] without agoraphobia: Secondary | ICD-10-CM | POA: Insufficient documentation

## 2013-05-27 DIAGNOSIS — F17200 Nicotine dependence, unspecified, uncomplicated: Secondary | ICD-10-CM | POA: Insufficient documentation

## 2013-05-27 DIAGNOSIS — Y939 Activity, unspecified: Secondary | ICD-10-CM | POA: Insufficient documentation

## 2013-05-27 DIAGNOSIS — S99929A Unspecified injury of unspecified foot, initial encounter: Secondary | ICD-10-CM

## 2013-05-27 DIAGNOSIS — S59919A Unspecified injury of unspecified forearm, initial encounter: Secondary | ICD-10-CM

## 2013-05-27 DIAGNOSIS — S99919A Unspecified injury of unspecified ankle, initial encounter: Secondary | ICD-10-CM

## 2013-05-27 DIAGNOSIS — Z87440 Personal history of urinary (tract) infections: Secondary | ICD-10-CM | POA: Insufficient documentation

## 2013-05-27 DIAGNOSIS — S8990XA Unspecified injury of unspecified lower leg, initial encounter: Secondary | ICD-10-CM | POA: Insufficient documentation

## 2013-05-27 DIAGNOSIS — W19XXXA Unspecified fall, initial encounter: Secondary | ICD-10-CM | POA: Insufficient documentation

## 2013-05-27 DIAGNOSIS — Z8709 Personal history of other diseases of the respiratory system: Secondary | ICD-10-CM | POA: Insufficient documentation

## 2013-05-27 DIAGNOSIS — Y929 Unspecified place or not applicable: Secondary | ICD-10-CM | POA: Insufficient documentation

## 2013-05-27 DIAGNOSIS — Z3202 Encounter for pregnancy test, result negative: Secondary | ICD-10-CM | POA: Insufficient documentation

## 2013-05-27 LAB — POC URINE PREG, ED: PREG TEST UR: NEGATIVE

## 2013-05-27 MED ORDER — IBUPROFEN 800 MG PO TABS
800.0000 mg | ORAL_TABLET | Freq: Once | ORAL | Status: DC
  Filled 2013-05-27: qty 1

## 2013-05-27 MED ORDER — LORAZEPAM 1 MG PO TABS: 1.0000 mg | ORAL_TABLET | Freq: Three times a day (TID) | ORAL | Status: DC | PRN

## 2013-05-27 MED ORDER — HYDROCODONE-ACETAMINOPHEN 5-325 MG PO TABS: 1.0000 | ORAL_TABLET | Freq: Four times a day (QID) | ORAL | Status: DC | PRN

## 2013-05-27 NOTE — ED Notes (Addendum)
Fall last night "I was drunk",  Face swollen, superficial lac to nose.  Contusion to lt forearm , denies neck pain.    Says she has lac to back of head also.  Alert, talking.    Has not taken librium for 2 days,

## 2013-05-27 NOTE — ED Provider Notes (Signed)
CSN: [id number]     Arrival date & time 05/27/13  1231 History  This chart was scribed for Carmin Muskrat, MD by Elby Beck, ED Scribe. This patient was seen in room APA09/APA09 and the patient's care was started at 4:30 PM.  Chief Complaint  Patient presents with  . Fall    The history is provided by the patient. No language interpreter was used.    HPI Comments: Rita Baldwin is a 28 y.o. female who presents to the Emergency Department complaining of a multiple falls that occurred last night while pt reports being intoxicated. She states that she normally takes 25 mg of Librium, up to 4x/day, due to a history of alcohol abuse, however she had not has not had this medication in a few days. She reports that she chose to drink alcohol to celebrate a job interview yesterday. She reports that she then received a phone call with bad news, which led her to "drink way too much, too fast". She is complaining of pain, bruising and a laceration to her nose area onset after the falls. She also reports having left wrist pain, that is worsened with movement, as well as right hand pain and bruising. She further reports bilateral knee pain, a headache, left lower neck pain and lower back pain today. She also notes that she had some dizziness this morning, which has resolved. She reports that her pain is worsened with walking, although she states that she has been able to ambulate normally, without any unsteadiness. She reports that she has not tried any medications for her pain or symptoms today. She reports that she takes Clonidine as needed for panic attacks, but that she has no other regular medications or chronic medical conditions. She denies visual disturbances, vomiting, LOC or any other symptoms.   Past Medical History  Diagnosis Date  . Drug abuse   . UTI (lower urinary tract infection)   . Bronchitis    Past Surgical History  Procedure Laterality Date  . Multiple tooth extractions      History reviewed. No pertinent family history. History  Substance Use Topics  . Smoking status: Current Every Day Smoker -- 0.50 packs/day for 9 years    Types: Cigarettes  . Smokeless tobacco: Never Used  . Alcohol Use: Yes   OB History   Grav Para Term Preterm Abortions TAB SAB Ect Mult Living            0     Review of Systems  Constitutional:       Per HPI, otherwise negative  HENT:       Per HPI, otherwise negative  Respiratory:       Per HPI, otherwise negative  Cardiovascular:       Per HPI, otherwise negative  Gastrointestinal: Negative for vomiting.  Endocrine:       Negative aside from HPI  Genitourinary:       Neg aside from HPI   Musculoskeletal:       Per HPI, otherwise negative  Skin: Negative.   Neurological: Negative for syncope.    Allergies  Review of patient's allergies indicates no known allergies.  Home Medications   Current Outpatient Rx  Name  Route  Sig  Dispense  Refill  . chlordiazePOXIDE (LIBRIUM) 25 MG capsule   Oral   Take 25 mg by mouth 3 (three) times daily as needed for anxiety.         . cloNIDine (CATAPRES) 0.1 MG tablet  Oral   Take 0.1 mg by mouth 3 (three) times daily as needed. sleep         . PRESCRIPTION MEDICATION   Oral   Take 1 tablet by mouth daily. Birth Control **Jalissa*          Triage Vitals: BP 116/80  Pulse 120  Temp(Src) 98.1 F (36.7 C) (Oral)  Resp 20  Ht 5\' 1"  (1.549 m)  Wt 105 lb (47.628 kg)  BMI 19.85 kg/m2  SpO2 100%  LMP 04/22/2013  Physical Exam  Nursing note and vitals reviewed. Constitutional: She is oriented to person, place, and time. She appears well-developed and well-nourished. No distress.  HENT:  Head: Normocephalic.  Right maxillary tenderness to palpation. 1 cm laceration to posterior scalp. No active bleeding.  Erythema on bridge of nose with a non-bleeding 1 cm superficial laceration. No septal hematoma.   Eyes: Conjunctivae and EOM are normal. Pupils are equal,  round, and reactive to light.  Cardiovascular: Normal rate and regular rhythm.   Pulmonary/Chest: Effort normal and breath sounds normal. No stridor. No respiratory distress.  Abdominal: She exhibits no distension.  Musculoskeletal: She exhibits tenderness. She exhibits no edema.  Ecchymotic area on dorsum of proximal 4th digit of the right hand. No deformity.  Ecchymotic area at mid-medial left forearm that is tender to palpation, with tenderness on wrist abduction.   Bilateral knees stable. No deformity.  Neurological: She is alert and oriented to person, place, and time. No cranial nerve deficit.  Skin: Skin is warm and dry.  Psychiatric: She has a normal mood and affect.    ED Course  Procedures (including critical care time)  DIAGNOSTIC STUDIES: Oxygen Saturation is 100% on RA, normal by my interpretation.    COORDINATION OF CARE: 4:38 PM- Pt advised of plan for treatment and pt agrees.  Medications  ibuprofen (ADVIL,MOTRIN) tablet 800 mg (800 mg Oral Not Given 05/27/13 1653)   Labs Review Labs Reviewed  POC URINE PREG, ED   Imaging Review Dg Wrist Complete Left  05/27/2013   CLINICAL DATA:  Pain post trauma  EXAM: LEFT WRIST - COMPLETE 3+ VIEW  COMPARISON:  March 21, 2013  FINDINGS: Frontal, oblique, lateral, and ulnar deviation scaphoid images were obtained. There is no fracture or dislocation. Joint spaces appear intact. No erosive change.  IMPRESSION: No abnormality noted.   Electronically Signed   By: Lowella Grip M.D.   On: 05/27/2013 17:12   Ct Maxillofacial Wo Cm  05/27/2013   CLINICAL DATA:  Pain post trauma  EXAM: CT MAXILLOFACIAL WITHOUT CONTRAST  TECHNIQUE: Multidetector CT imaging of the maxillofacial structures was performed. Multiplanar CT image reconstructions were also generated. A small metallic BB was placed on the right temple in order to reliably differentiate right from left.  COMPARISON:  Brain CT May 12, 2009  FINDINGS: There is a small  avulsion off the anterior left nasal bone. There is no other evidence of fracture. There is no appreciable dislocation. There are no intraorbital lesions.  There is extensive opacification in the left ethmoid sinus complex. There is patchy sinusitis in the right ethmoid sinus complex. There is mucosal thickening in the posterior left sphenoid sinus. There is also mucosal thickening in both maxillary antra, more on the left than on the right.  There is obstruction of both ostiomeatal unit complexes. There is rightward deviation of the upper nasal septum. There is mild nasal turbinate edema on the left with mild nares obstruction inferiorly on the left.  The  visualized brain parenchyma appears within normal limits.  IMPRESSION: Small avulsion arising from the anterior left nasal bone. No other fracture. Extensive paranasal sinus disease. Obstruction both ostiomeatal unit complexes. Paranasal sinus disease overall is significantly more pronounced on the left than on the right. There may be some intermingled hemorrhage in these areas. There is mild rightward deviation of the nasal septum.   Electronically Signed   By: Lowella Grip M.D.   On: 05/27/2013 17:27   5:47 PM On repeat exam the patient awakened for, with no evidence of respiratory compromise. She has no complaints. She is aware of all results. She is aware of return precautions, follow up instructions.  She was counseled on etoh use  MDM   Final diagnoses:  Nasal bone fracture  Fall    I personally performed the services described in this documentation, which was scribed in my presence. The recorded information has been reviewed and is accurate.   This young female presents one day after a fall with pain in her nose and face.  Notably, the patient Is neurologically intact, awake, alert, hemodynamically stable. Patient's evaluation demonstrates nasal fracture.  In addition the patient has history of alcohol abuse, and I spent a  substantial amount time with patient about alcohol cessation. Patient was appropriate for discharge with further evaluation, management as an outpatient after stabilization here.  Carmin Muskrat, MD 05/27/13 [phone]

## 2013-05-27 NOTE — Discharge Instructions (Signed)
As discussed, there is a small fracture of your nose.  Please continue to use ice packs liberally, prescribed medication as needed for pain control.  Return here for concerning changes in her condition, otherwise, please be sure to follow up with our ENT specialists in one week for further evaluation of your nose fracture.

## 2013-08-26 ENCOUNTER — Encounter (HOSPITAL_COMMUNITY): Payer: Self-pay | Admitting: Emergency Medicine

## 2013-08-26 ENCOUNTER — Emergency Department (HOSPITAL_COMMUNITY)
Admission: EM | Admit: 2013-08-26 | Discharge: 2013-08-26 | Disposition: A | Discharge status: Home / Self Care | Payer: Self-pay | Attending: Emergency Medicine | Admitting: Emergency Medicine

## 2013-08-26 DIAGNOSIS — Z87440 Personal history of urinary (tract) infections: Secondary | ICD-10-CM | POA: Insufficient documentation

## 2013-08-26 DIAGNOSIS — Z8709 Personal history of other diseases of the respiratory system: Secondary | ICD-10-CM | POA: Insufficient documentation

## 2013-08-26 DIAGNOSIS — K0889 Other specified disorders of teeth and supporting structures: Secondary | ICD-10-CM

## 2013-08-26 DIAGNOSIS — Z79899 Other long term (current) drug therapy: Secondary | ICD-10-CM | POA: Insufficient documentation

## 2013-08-26 DIAGNOSIS — F17200 Nicotine dependence, unspecified, uncomplicated: Secondary | ICD-10-CM | POA: Insufficient documentation

## 2013-08-26 DIAGNOSIS — K006 Disturbances in tooth eruption: Secondary | ICD-10-CM | POA: Insufficient documentation

## 2013-08-26 DIAGNOSIS — K089 Disorder of teeth and supporting structures, unspecified: Secondary | ICD-10-CM | POA: Insufficient documentation

## 2013-08-26 DIAGNOSIS — Z792 Long term (current) use of antibiotics: Secondary | ICD-10-CM | POA: Insufficient documentation

## 2013-08-26 MED ORDER — HYDROCODONE-ACETAMINOPHEN 5-325 MG PO TABS
2.0000 | ORAL_TABLET | ORAL | Status: DC | PRN
Start: 2013-08-26 — End: 2014-09-08

## 2013-08-26 MED ORDER — AMOXICILLIN 250 MG PO CAPS
500.0000 mg | ORAL_CAPSULE | Freq: Once | ORAL | Status: AC
  Administered 2013-08-26: 500 mg via ORAL
  Filled 2013-08-26: qty 2

## 2013-08-26 MED ORDER — KETOROLAC TROMETHAMINE 60 MG/2ML IM SOLN
60.0000 mg | Freq: Once | INTRAMUSCULAR | Status: AC
  Administered 2013-08-26: 60 mg via INTRAMUSCULAR
  Filled 2013-08-26: qty 2

## 2013-08-26 MED ORDER — AMOXICILLIN 500 MG PO CAPS: 500.0000 mg | ORAL_CAPSULE | Freq: Three times a day (TID) | ORAL | Status: DC

## 2013-08-26 NOTE — ED Notes (Signed)
Patient with continued pain at time. Respirations even and unlabored. Skin warm/dry. Discharge instructions reviewed with patient at this time. Patient given opportunity to voice concerns/ask questions. Patient discharged at this time and left Emergency Department with steady gait.

## 2013-08-26 NOTE — ED Provider Notes (Signed)
CSN: [id number]     Arrival date & time 08/26/13  2126 History   First MD Initiated Contact with Patient 08/26/13 2220     Chief Complaint  Patient presents with  . Dental Pain     (Consider location/radiation/quality/duration/timing/severity/associated sxs/prior Treatment) Patient is a 28 y.o. female presenting with tooth pain. The history is provided by the patient. No language interpreter was used.  Dental Pain Location:  Upper Upper teeth location:  1/RU 3rd molar Quality:  Constant Timing:  Constant Progression:  Worsening Chronicity:  New Relieved by:  Nothing Worsened by:  Nothing tried Ineffective treatments:  None tried Associated symptoms: facial pain   Risk factors: lack of dental care     Past Medical History  Diagnosis Date  . Drug abuse   . UTI (lower urinary tract infection)   . Bronchitis    Past Surgical History  Procedure Laterality Date  . Multiple tooth extractions     History reviewed. No pertinent family history. History  Substance Use Topics  . Smoking status: Current Every Day Smoker -- 0.50 packs/day for 9 years    Types: Cigarettes  . Smokeless tobacco: Never Used  . Alcohol Use: Yes   OB History   Grav Para Term Preterm Abortions TAB SAB Ect Mult Living            0     Review of Systems  HENT: Positive for dental problem.   All other systems reviewed and are negative.     Allergies  Review of patient's allergies indicates no known allergies.  Home Medications   Prior to Admission medications   Medication Sig Start Date End Date Taking? Authorizing Provider  cephALEXin (KEFLEX) 500 MG capsule Take 500 mg by mouth 4 (four) times daily. LEFTOVER MEDICATION---leftover from previous fill taken once today for symptoms   Yes Historical Provider, MD  chlordiazePOXIDE (LIBRIUM) 25 MG capsule Take 25 mg by mouth 2 (two) times daily.   Yes Historical Provider, MD  cloNIDine (CATAPRES) 0.1 MG tablet Take 0.1 mg by mouth 3 (three) times  daily as needed (for panic attacks). sleep   Yes Historical Provider, MD  gabapentin (NEURONTIN) 300 MG capsule Take 300 mg by mouth at bedtime.   Yes Historical Provider, MD  levonorgestrel-ethinyl estradiol (SEASONALE,INTROVALE,JOLESSA) 0.15-0.03 MG tablet Take 1 tablet by mouth daily.   Yes Historical Provider, MD  amoxicillin (AMOXIL) 500 MG capsule Take 1 capsule (500 mg total) by mouth 3 (three) times daily. 08/26/13   Fransico Meadow, PA-C  HYDROcodone-acetaminophen (NORCO/VICODIN) 5-325 MG per tablet Take 2 tablets by mouth every 4 (four) hours as needed for moderate pain. 08/26/13   Fransico Meadow, PA-C   BP 132/97  Pulse 125  Temp(Src) 98.1 F (36.7 C) (Oral)  Resp 18  Ht 5\' 1"  (1.549 m)  Wt 105 lb (47.628 kg)  BMI 19.85 kg/m2  SpO2 98% Physical Exam  Nursing note and vitals reviewed. Constitutional: She is oriented to person, place, and time. She appears well-developed and well-nourished.  HENT:  Head: Normocephalic.  Poor dentition,  No obvious abscess.    Eyes: EOM are normal.  Neck: Normal range of motion.  Pulmonary/Chest: Effort normal.  Abdominal: She exhibits no distension.  Musculoskeletal: Normal range of motion.  Neurological: She is alert and oriented to person, place, and time.  Skin: Skin is warm.  Psychiatric: She has a normal mood and affect.    ED Course  Procedures (including critical care time) Labs Review Labs Reviewed -  No data to display  Imaging Review No results found.   EKG Interpretation None      MDM   Final diagnoses:  Toothache    Pt given torodol Im.   Pt given rx for hydrocodone and amoxicillian.      [place], PA-C 08/26/13 2240

## 2013-08-26 NOTE — Discharge Instructions (Signed)
Dental Pain A tooth ache may be caused by cavities (tooth decay). Cavities expose the nerve of the tooth to air and hot or cold temperatures. It may come from an infection or abscess (also called a boil or furuncle) around your tooth. It is also often caused by dental caries (tooth decay). This causes the pain you are having. DIAGNOSIS  Your caregiver can diagnose this problem by exam. TREATMENT   If caused by an infection, it may be treated with medications which kill germs (antibiotics) and pain medications as prescribed by your caregiver. Take medications as directed.  Only take over-the-counter or prescription medicines for pain, discomfort, or fever as directed by your caregiver.  Whether the tooth ache today is caused by infection or dental disease, you should see your dentist as soon as possible for further care. SEEK MEDICAL CARE IF: The exam and treatment you received today has been provided on an emergency basis only. This is not a substitute for complete medical or dental care. If your problem worsens or new problems (symptoms) appear, and you are unable to meet with your dentist, call or return to this location. SEEK IMMEDIATE MEDICAL CARE IF:   You have a fever.  You develop redness and swelling of your face, jaw, or neck.  You are unable to open your mouth.  You have severe pain uncontrolled by pain medicine. MAKE SURE YOU:   Understand these instructions.  Will watch your condition.  Will get help right away if you are not doing well or get worse. Document Released: 03/03/2005 Document Revised: 05/26/2011 Document Reviewed: 10/20/2007 [facility] Patient Information 2014 [place].  Dental Pain A tooth ache may be caused by cavities (tooth decay). Cavities expose the nerve of the tooth to air and hot or cold temperatures. It may come from an infection or abscess (also called a boil or furuncle) around your tooth. It is also often caused by dental caries (tooth  decay). This causes the pain you are having. DIAGNOSIS  Your caregiver can diagnose this problem by exam. TREATMENT   If caused by an infection, it may be treated with medications which kill germs (antibiotics) and pain medications as prescribed by your caregiver. Take medications as directed.  Only take over-the-counter or prescription medicines for pain, discomfort, or fever as directed by your caregiver.  Whether the tooth ache today is caused by infection or dental disease, you should see your dentist as soon as possible for further care. SEEK MEDICAL CARE IF: The exam and treatment you received today has been provided on an emergency basis only. This is not a substitute for complete medical or dental care. If your problem worsens or new problems (symptoms) appear, and you are unable to meet with your dentist, call or return to this location. SEEK IMMEDIATE MEDICAL CARE IF:   You have a fever.  You develop redness and swelling of your face, jaw, or neck.  You are unable to open your mouth.  You have severe pain uncontrolled by pain medicine. MAKE SURE YOU:   Understand these instructions.  Will watch your condition.  Will get help right away if you are not doing well or get worse. Document Released: 03/03/2005 Document Revised: 05/26/2011 Document Reviewed: 10/20/2007 [facility] Patient Information 2014 [place].  Emergency Department Resource Guide 1) Find a Doctor and Pay Out of Pocket Although you won't have to find out who is covered by your insurance plan, it is a good idea to ask around and get recommendations. You  will then need to call the office and see if the doctor you have chosen will accept you as a new patient and what types of options they offer for patients who are self-pay. Some doctors offer discounts or will set up payment plans for their patients who do not have insurance, but you will need to ask so you aren't surprised when you get to your  appointment.  2) Contact Your Local Health Department Not all health departments have doctors that can see patients for sick visits, but many do, so it is worth a call to see if yours does. If you don't know where your local health department is, you can check in your phone book. The CDC also has a tool to help you locate your state's health department, and many state websites also have listings of all of their local health departments.  3) Find a [facility] If your illness is not likely to be very severe or complicated, you may want to try a walk in clinic. These are popping up all over the country in pharmacies, drugstores, and shopping centers. They're usually staffed by nurse practitioners or physician assistants that have been trained to treat common illnesses and complaints. They're usually fairly quick and inexpensive. However, if you have serious medical issues or chronic medical problems, these are probably not your best option.  No Primary Care Doctor: - Call Health Connect at  [phone] - they can help you locate a primary care doctor that  accepts your insurance, provides certain services, etc. - Physician Referral Service- [phone]  Chronic Pain Problems: Organization         Address  Phone   Notes  [facility]  [phone] Patients need to be referred by their primary care doctor.   Medication Assistance: Organization         Address  Phone   Notes  [facility] Medication [facility] [address] [place], [place] [address] [phone] --Must be a resident of [facility] -- Must have NO insurance coverage whatsoever (no Medicaid/ Medicare, etc.) -- The pt. MUST have a primary care doctor that directs their care regularly and follows them in the community   MedAssist  [phone]   [employer]  [phone]    Agencies that provide inexpensive medical care: Organization         Address  Phone   Notes  [place]  [phone]   [facility]    [phone]   [facility] [place], [place] [address] [phone]   [facility] [address], [place] [phone]   Planned Parenthood    [phone]   [facility]    [phone]   [place] and [place] [address], [place] Phone:  [phone], Fax:  [phone] Hours of Operation:  9 am - 6 pm, M-F.  Also accepts Medicaid/Medicare and self-pay.  [facility] for [place], [address], [place] Phone: [phone], Fax: [phone]. Hours of Operation:  8:30 am - 5:30 pm, M-F.  Also accepts Medicaid and self-pay.  [facility] [place] [address] [place] Phone: [phone]   [place], [place], [place] [phone], Ext. [phone] Mondays & Thursdays: 7-9 AM.  First 15 patients are seen on a first come, first  serve basis.    [place] Providers:  Organization         Address  Phone   Notes  [facility] [address], [place] [phone] Also accepts self-pay patients.  [facility] [address], [place]  [phone]   [place], [address], [place] [phone]   [facility] [address], [place] [phone]   Lucianne Lei [address], [place]   [phone] Only accepts [place] Access [place] patients after they have their name applied to their card.   Self-Pay (no insurance) in [facility]:  Organization         Address  Phone   Notes  Sickle Cell Patients, [facility] [place] [phone]   [facility] Urgent Care [place] [phone]   [facility]  [place]  [place], [place], [place] [phone]   [facility]/Dr. Osei-Bonsu  [address] [place] or [address], [place] [phone] Phone number for both [place] and [place] locations is the same.  Urgent Medical and [facility] [address] [place] [phone]   [facility] [address], [place] or [address] [phone]   [facility] [address] [place] [phone], phone; [phone], fax Sees patients 1st and 3rd Saturday of every month.  Must not qualify for public or private insurance (i.e. Medicaid, Medicare, [place] Health Choice, Veterans' Benefits)  Household income should be no more than 200% of the poverty level The clinic cannot treat you if you are pregnant or think you are pregnant  Sexually transmitted diseases are not treated at the clinic.    Dental Care: Organization         Address  Phone  Notes  [facility] Department of [facility] [place] [phone] Accepts children up to age 10 who are enrolled in [place] or [place]; pregnant women with a Medicaid card; and children who have applied for Medicaid or [place] Health Choice, but were declined, whose parents can pay a reduced fee at time of service.  [facility] Department of [facility]  [address], [place] [phone] Accepts children up to age 61 who are enrolled in [place] or [place]; pregnant women with a Medicaid card; and children who have applied for Medicaid or [place] Health Choice, but were declined, whose parents can pay a reduced fee at time of service.  [place] Adult Dental Access PROGRAM  [place] [phone] Patients are seen by appointment only. Walk-ins are not accepted. [place] will see patients 52 years of age and  older. Monday - Tuesday (8am-5pm) Most Wednesdays (8:30-5pm) $30 per visit, cash only  [facility] Adult Dental Access PROGRAM  [address], [facility] [phone] Patients are seen by appointment only. Walk-ins are not accepted. Sells will see patients 99 years of age and older. One Wednesday Evening (Monthly: Volunteer Based).  $30 per visit, cash only  [place]  [phone] for adults; Children under age 50, call Graduate Pediatric Dentistry at [phone]. Children aged 65-14, please call (  [phone]) [phone] to request a pediatric application.  Dental services are provided in all areas of dental care including fillings, crowns and bridges, complete and partial dentures, implants, gum treatment, root canals, and extractions. Preventive care is also provided. Treatment is provided to both adults and children. Patients are selected via a lottery and there is often a waiting list.   [facility] [address], [place]  [phone] [email or web address]   [facility] [address] [place], [place] [phone], Ext. [phone] Second and Fourth Thursday of each month, opens at 6:30 AM; Clinic ends at 9 AM.  Patients are seen on a first-come first-served basis, and a limited number are seen during each clinic.   [facility]  [address] Hillard Danker [place], [place] [phone]   Eligibility Requirements You must have lived in [place], [place], or [place] counties for at least the last three months.   You cannot be eligible for state or federal sponsored [employer], including [employer], [place], or [employer].   You generally cannot be eligible for healthcare insurance through your employer.    How to apply: Eligibility screenings are held every Tuesday and Wednesday afternoon from 1:00 pm until 4:00 pm. You do not need an appointment for the interview!  [facility] [address]  [place], [place]   [place]  [place] Department  [place]  [phone]    Behavioral Health Resources in the Community: Intensive Outpatient Programs Organization         Address  Phone  Notes  [place]. [address] [place], [place] [phone]   [facility] Outpatient [address] [place], [place]   ADS: Alcohol & Drug Svcs [address], [place], [place]   [place] [address]  [place], [place] or [phone]   Substance Abuse Resources Organization         Address  Phone  Notes  Alcohol and Drug Services  [phone]   [place]  [phone]   [facility] [place]   Chinita Pester  [phone]   Residential & Outpatient Substance Abuse Program  [phone]   Psychological Services Organization         Address  Phone  Notes  [facility] [place]  [place]  [phone]   [place] [address], [place] or [phone]    Mobile Crisis Teams Organization         Address  Phone  Notes  [facility], Mobile Crisis Care Unit  [phone]   [facility]ive Psychotherapeutic Services  [address] [place], [place]   [place] [address], [place] [phone]    Self-Help/Support Groups Organization         Address  Phone             Notes  Ali Molina. of [place] - variety of support groups  [place] Call for more information  Narcotics Anonymous (NA), [facility] [address], [employer]   2 meetings at this location   Special educational needs teacher         Address  Phone  Notes  ASAP Residential Treatment [place],    [place]  [phone]   [place]  [id number], [place], [place]   [place],  [phone] Admissions: 8am-3pm M-F  Incentives Substance [place] [address],    [place], [place] [phone]   [facility] [address] [place], [place], [place]   [facility] [address],  [place], [place]   [facility] - Intensive Outpatient [address], [place], [place]   [facility] ([place]  [place], [place] [phone] or [phone]   [facility]idential [facility] Services (RTS) [address], [place], [place] Accepts Medicaid  [facility] [place] [address]  [place] [phone] Substance Abuse/Addiction Treatment   [facility] Organization         Address  Phone  Notes  [facility]  [phone]   Domenic Schwab, PhD [address] Arlis Porta [place], [place]   [phone] or [phone]   [place], [place] [phone]   [facility] [address] [place], [place] [phone] Insurance/Medicaid/sponsorship through [facility] and Families [address], [address] [place]                                    [place], [place] [phone] [place] [address], [place] [phone]    Dr. Adele Schilder  [phone]   [facility] [place] Dept. 1) [address], [place] 2) [place] 3)  [place] [address], [place] [phone]  [phone]   [place] [phone] or [phone] (After Hours)

## 2013-08-26 NOTE — ED Provider Notes (Signed)
Medical screening examination/treatment/procedure(s) were performed by non-physician practitioner and as supervising physician I was immediately available for consultation/collaboration.   EKG Interpretation None        Orpah Greek, MD 08/26/13 2243

## 2013-08-26 NOTE — ED Notes (Signed)
Upper rt molar pain, thinks she has an abscess.

## 2014-09-08 ENCOUNTER — Emergency Department (HOSPITAL_COMMUNITY): Payer: Self-pay

## 2014-09-08 ENCOUNTER — Emergency Department (HOSPITAL_COMMUNITY)
Admission: EM | Admit: 2014-09-08 | Discharge: 2014-09-08 | Disposition: A | Discharge status: Home / Self Care | Payer: Self-pay | Attending: Emergency Medicine | Admitting: Emergency Medicine

## 2014-09-08 ENCOUNTER — Encounter (HOSPITAL_COMMUNITY): Payer: Self-pay | Admitting: Cardiology

## 2014-09-08 DIAGNOSIS — Z87440 Personal history of urinary (tract) infections: Secondary | ICD-10-CM | POA: Insufficient documentation

## 2014-09-08 DIAGNOSIS — Z79899 Other long term (current) drug therapy: Secondary | ICD-10-CM | POA: Insufficient documentation

## 2014-09-08 DIAGNOSIS — M542 Cervicalgia: Secondary | ICD-10-CM | POA: Insufficient documentation

## 2014-09-08 DIAGNOSIS — Z8709 Personal history of other diseases of the respiratory system: Secondary | ICD-10-CM | POA: Insufficient documentation

## 2014-09-08 DIAGNOSIS — Z3202 Encounter for pregnancy test, result negative: Secondary | ICD-10-CM | POA: Insufficient documentation

## 2014-09-08 DIAGNOSIS — R519 Headache, unspecified: Secondary | ICD-10-CM

## 2014-09-08 DIAGNOSIS — R51 Headache: Secondary | ICD-10-CM | POA: Insufficient documentation

## 2014-09-08 DIAGNOSIS — Z720 Tobacco use: Secondary | ICD-10-CM | POA: Insufficient documentation

## 2014-09-08 DIAGNOSIS — Z793 Long term (current) use of hormonal contraceptives: Secondary | ICD-10-CM | POA: Insufficient documentation

## 2014-09-08 LAB — POC URINE PREG, ED: PREG TEST UR: NEGATIVE

## 2014-09-08 MED ORDER — KETOROLAC TROMETHAMINE 30 MG/ML IJ SOLN
30.0000 mg | Freq: Once | INTRAMUSCULAR | Status: AC
  Administered 2014-09-08: 30 mg via INTRAVENOUS
  Filled 2014-09-08: qty 1

## 2014-09-08 MED ORDER — DIPHENHYDRAMINE HCL 50 MG/ML IJ SOLN
25.0000 mg | Freq: Once | INTRAMUSCULAR | Status: AC
  Administered 2014-09-08: 25 mg via INTRAVENOUS
  Filled 2014-09-08: qty 1

## 2014-09-08 MED ORDER — SODIUM CHLORIDE 0.9 % IV BOLUS (SEPSIS)
1000.0000 mL | Freq: Once | INTRAVENOUS | Status: AC
  Administered 2014-09-08: 1000 mL via INTRAVENOUS

## 2014-09-08 MED ORDER — METOCLOPRAMIDE HCL 5 MG/ML IJ SOLN
10.0000 mg | Freq: Once | INTRAMUSCULAR | Status: AC
  Administered 2014-09-08: 10 mg via INTRAVENOUS
  Filled 2014-09-08: qty 2

## 2014-09-08 MED ORDER — DEXAMETHASONE SODIUM PHOSPHATE 10 MG/ML IJ SOLN
10.0000 mg | Freq: Once | INTRAMUSCULAR | Status: AC
  Administered 2014-09-08: 10 mg via INTRAVENOUS
  Filled 2014-09-08: qty 1

## 2014-09-08 NOTE — ED Notes (Signed)
Pt given ginger ale to sip and graham crackers with peanut butter to eat.

## 2014-09-08 NOTE — ED Provider Notes (Signed)
CSN: [id number]     Arrival date & time 09/08/14  0910 History   First MD Initiated Contact with Patient 09/08/14 0914     Chief Complaint  Patient presents with  . Headache     (Consider location/radiation/quality/duration/timing/severity/associated sxs/prior Treatment) Patient is a 29 y.o. female presenting with headaches. The history is provided by the patient.  Headache Associated symptoms: no abdominal pain, no congestion, no dizziness, no fever, no nausea, no neck pain, no numbness, no sore throat and no weakness    Rita Baldwin is a 29 y.o. female with a distant history of migraine headache, also with history of former drug abuse, denies current use, presenting with an intractable headache since yesterday.  She describes having bad headaches when she was started on buspar over a month ago by her mental health provider and did a quick 9 day taper, last dose taken one week ago but still headache symptoms.  She also endorses she has been swimming daily at [facility] and has been using a tree limb to jump from about 40 ft into the lake.  Yesterday, she landed causing mild discomfort to her neck, denies pain, weakness or numbness in her extremities.  She also denies dizziness or focal weakness.   Her pain in bilateral temple area, throbbing, constant with both photo and phonophobia.  She denies fevers, chills, vomiting, but has had nausea.  She took a bc powder prior to arrival without relief.      Past Medical History  Diagnosis Date  . Drug abuse   . UTI (lower urinary tract infection)   . Bronchitis    Past Surgical History  Procedure Laterality Date  . Multiple tooth extractions     History reviewed. No pertinent family history. History  Substance Use Topics  . Smoking status: Current Every Day Smoker -- 0.50 packs/day for 9 years    Types: Cigarettes  . Smokeless tobacco: Never Used  . Alcohol Use: No   OB History    Gravida Para Term Preterm AB TAB SAB Ectopic  Multiple Living            0     Review of Systems  Constitutional: Negative for fever.  HENT: Negative for congestion and sore throat.   Eyes: Negative.   Respiratory: Negative for chest tightness and shortness of breath.   Cardiovascular: Negative for chest pain.  Gastrointestinal: Negative for nausea and abdominal pain.  Genitourinary: Negative.   Musculoskeletal: Negative for joint swelling, arthralgias and neck pain.  Skin: Negative.  Negative for rash and wound.  Neurological: Positive for headaches. Negative for dizziness, speech difficulty, weakness, light-headedness and numbness.  Psychiatric/Behavioral: Negative.       Allergies  Review of patient's allergies indicates no known allergies.  Home Medications   Prior to Admission medications   Medication Sig Start Date End Date Taking? Authorizing Provider  Aspirin-Salicylamide-Caffeine (BC HEADACHE POWDER PO) Take 1 packet by mouth daily as needed (headaches).   Yes Historical Provider, MD  atenolol (TENORMIN) 25 MG tablet Take 25 mg by mouth daily.   Yes Historical Provider, MD  chlordiazePOXIDE (LIBRIUM) 10 MG capsule Take 10 mg by mouth 2 (two) times daily.   Yes Historical Provider, MD  gabapentin (NEURONTIN) 400 MG capsule Take 400-800 mg by mouth 2 (two) times daily. 1 in morning and 2 at bedtime   Yes Historical Provider, MD  levonorgestrel-ethinyl estradiol (SEASONALE,INTROVALE,JOLESSA) 0.15-0.03 MG tablet Take 1 tablet by mouth daily.   Yes Historical Provider, MD  ranitidine (ZANTAC) 300 MG tablet Take 300 mg by mouth daily.   Yes Historical Provider, MD   BP 127/92 mmHg  Pulse 62  Temp(Src) 98.1 F (36.7 C) (Oral)  Resp 16  Ht 5\' 1"  (1.549 m)  Wt 150 lb (68.04 kg)  BMI 28.36 kg/m2  SpO2 99% Physical Exam  Constitutional: She is oriented to person, place, and time. She appears well-developed and well-nourished.  Uncomfortable appearing  HENT:  Head: Normocephalic and atraumatic.  Mouth/Throat:  Oropharynx is clear and moist.  Eyes: EOM are normal. Pupils are equal, round, and reactive to light.  Neck: Normal range of motion. Neck supple.  Cardiovascular: Normal rate and normal heart sounds.   Pulmonary/Chest: Effort normal.  Abdominal: Soft. There is no tenderness.  Musculoskeletal: Normal range of motion.       Cervical back: She exhibits tenderness. She exhibits no bony tenderness and no spasm.  Bilateral c spine soreness. No edema, no step offs.  FROM with minimal discomfort.  Lymphadenopathy:    She has no cervical adenopathy.  Neurological: She is alert and oriented to person, place, and time. She has normal strength. No sensory deficit. Gait normal. GCS eye subscore is 4. GCS verbal subscore is 5. GCS motor subscore is 6.  Normal heel-shin, normal rapid alternating movements. Cranial nerves III-XII intact.  No pronator drift.  Skin: Skin is warm and dry. No rash noted.  Psychiatric: She has a normal mood and affect. Her speech is normal and behavior is normal. Thought content normal. Cognition and memory are normal.  Nursing note and vitals reviewed.   ED Course  Procedures (including critical care time) Labs Review Labs Reviewed  POC URINE PREG, ED    Imaging Review Dg Cervical Spine Complete  09/08/2014   CLINICAL DATA:  Dizziness, headache, neck pain  EXAM: CERVICAL SPINE  4+ VIEWS  COMPARISON:  None available  FINDINGS: There is no evidence of cervical spine fracture or prevertebral soft tissue swelling. Alignment is normal. No other significant bone abnormalities are identified.  IMPRESSION: No acute finding by plain radiography   Electronically Signed   By: Jerilynn Mages.  Shick M.D.   On: 09/08/2014 11:33     EKG Interpretation None      MDM   Final diagnoses:  Acute nonintractable headache, unspecified headache type    12:59 PM Pt given IV fluids, reglan, decadron and benadryl with headache now 5/10 from 7/10.  Toradol 30 mg IV added.  Pt given fluids, snack,  headache improved.  Advised recheck here for any worsened sx.  Referral given for obtaining pcp.    Evalee Jefferson, PA-C 09/08/14 [place], MD 09/11/14 [phone]

## 2014-09-08 NOTE — ED Notes (Signed)
PA at bedside.

## 2014-09-08 NOTE — ED Notes (Signed)
Headache and nausea since last night.  

## 2014-09-08 NOTE — ED Notes (Signed)
Pt awake and alert now. States she feels fine to drive as she is no longer drowsy.

## 2014-09-08 NOTE — Discharge Instructions (Signed)
Migraine Headache A migraine headache is an intense, throbbing pain on one or both sides of your head. A migraine can last for 30 minutes to several hours. CAUSES  The exact cause of a migraine headache is not always known. However, a migraine may be caused when nerves in the brain become irritated and release chemicals that cause inflammation. This causes pain. Certain things may also trigger migraines, such as:  Alcohol.  Smoking.  Stress.  Menstruation.  Aged cheeses.  Foods or drinks that contain nitrates, glutamate, aspartame, or tyramine.  Lack of sleep.  Chocolate.  Caffeine.  Hunger.  Physical exertion.  Fatigue.  Medicines used to treat chest pain (nitroglycerine), birth control pills, estrogen, and some blood pressure medicines. SIGNS AND SYMPTOMS  Pain on one or both sides of your head.  Pulsating or throbbing pain.  Severe pain that prevents daily activities.  Pain that is aggravated by any physical activity.  Nausea, vomiting, or both.  Dizziness.  Pain with exposure to bright lights, loud noises, or activity.  General sensitivity to bright lights, loud noises, or smells. Before you get a migraine, you may get warning signs that a migraine is coming (aura). An aura may include:  Seeing flashing lights.  Seeing bright spots, halos, or zigzag lines.  Having tunnel vision or blurred vision.  Having feelings of numbness or tingling.  Having trouble talking.  Having muscle weakness. DIAGNOSIS  A migraine headache is often diagnosed based on:  Symptoms.  Physical exam.  A CT scan or MRI of your head. These imaging tests cannot diagnose migraines, but they can help rule out other causes of headaches. TREATMENT Medicines may be given for pain and nausea. Medicines can also be given to help prevent recurrent migraines.  HOME CARE INSTRUCTIONS  Only take over-the-counter or prescription medicines for pain or discomfort as directed by your  health care provider. The use of long-term narcotics is not recommended.  Lie down in a dark, quiet room when you have a migraine.  Keep a journal to find out what may trigger your migraine headaches. For example, write down:  What you eat and drink.  How much sleep you get.  Any change to your diet or medicines.  Limit alcohol consumption.  Quit smoking if you smoke.  Get 7-9 hours of sleep, or as recommended by your health care provider.  Limit stress.  Keep lights dim if bright lights bother you and make your migraines worse. SEEK IMMEDIATE MEDICAL CARE IF:   Your migraine becomes severe.  You have a fever.  You have a stiff neck.  You have vision loss.  You have muscular weakness or loss of muscle control.  You start losing your balance or have trouble walking.  You feel faint or pass out.  You have severe symptoms that are different from your first symptoms. MAKE SURE YOU:   Understand these instructions.  Will watch your condition.  Will get help right away if you are not doing well or get worse. Document Released: 03/03/2005 Document Revised: 07/18/2013 Document Reviewed: 11/08/2012 [facility] Patient Information 2015 [place], [place]. This information is not intended to replace advice given to you by your health care provider. Make sure you discuss any questions you have with your health care provider.     Emergency Department Resource Guide 1) Find a Doctor and Pay Out of Pocket Although you won't have to find out who is covered by your insurance plan, it is a good idea to ask around and  get recommendations. You will then need to call the office and see if the doctor you have chosen will accept you as a new patient and what types of options they offer for patients who are self-pay. Some doctors offer discounts or will set up payment plans for their patients who do not have insurance, but you will need to ask so you aren't surprised when you get to your  appointment.  2) Contact Your Local Health Department Not all health departments have doctors that can see patients for sick visits, but many do, so it is worth a call to see if yours does. If you don't know where your local health department is, you can check in your phone book. The CDC also has a tool to help you locate your state's health department, and many state websites also have listings of all of their local health departments.  3) Find a [facility] If your illness is not likely to be very severe or complicated, you may want to try a walk in clinic. These are popping up all over the country in pharmacies, drugstores, and shopping centers. They're usually staffed by nurse practitioners or physician assistants that have been trained to treat common illnesses and complaints. They're usually fairly quick and inexpensive. However, if you have serious medical issues or chronic medical problems, these are probably not your best option.  No Primary Care Doctor: - Call Health Connect at  [phone] - they can help you locate a primary care doctor that  accepts your insurance, provides certain services, etc. - Physician Referral Service- [phone]  Chronic Pain Problems: Organization         Address  Phone   Notes  [facility]  [phone] Patients need to be referred by their primary care doctor.   Medication Assistance: Organization         Address  Phone   Notes  [facility] [address] [address] [place], [place] [address] [phone] --Must be a resident of [facility] -- Must have NO insurance coverage whatsoever (no Medicaid/ Medicare, etc.) -- The pt. MUST have a primary care doctor that directs their care regularly and follows them in the community   [facility]ist  [phone]   [employer]  [phone]    Agencies that provide inexpensive medical care: Organization         Address  Phone   Notes  [place]  [phone]   [facility]    [phone]   [facility] [place], [place] [address] [phone]   [place] [address] [place] [phone]   Planned Parenthood    [phone]   [facility]    [phone]   [place] and [address], [place] Phone:  [phone], Fax:  [phone] Hours of Operation:  9 am - 6 pm, M-F.  Also accepts Medicaid/Medicare and self-pay.  [facility] for [place], [address], [place] Phone: [phone], Fax: [phone]. Hours of Operation:  8:30 am - 5:30 pm, M-F.  Also accepts Medicaid and self-pay.  [facility] [place] [address], [place] Phone: [phone]   [place], [place], [place] [phone], Ext. [phone] Mondays & Thursdays: 7-9 AM.  First 15 patients are seen on a  first come, first serve basis.    [place] Providers:  Organization         Address  Phone   Notes  [facility] [address], [place] [phone] Also accepts self-pay patients.  [facility] [address], [place]  [phone]   [place], [address], [place] [phone]   [facility] [address] [place] [phone]   Lucianne Lei [address], [place]   [phone] Only accepts [place] Access [place] patients after they have their name applied to their card.   Self-Pay (no insurance) in [facility]:  Organization         Address  Phone   Notes  Sickle Cell Patients, [facility] [place] [phone]   [facility] [place] [phone]   [facility]  [place]  [place], [place],  [phone]   [facility]/Dr. Osei-Bonsu  [address] [place] or [address], [place] [phone] Phone number for both [place] and [place] locations is the same.  [facility] [address] [place] [phone]   [facility] [address] [place] or [address] [phone]   [facility] [address] [place] [phone], phone; [phone], fax Sees patients 1st and 3rd Saturday of every month.  Must not qualify for public or private insurance (i.e. Medicaid, Medicare, [place] Health Choice, Veterans' Benefits)  Household income should be no more than 200% of the poverty level The clinic cannot treat you if you are pregnant or think you are pregnant  Sexually transmitted diseases are not treated at the clinic.    Dental Care: Organization         Address  Phone  Notes  [facility] Department of [facility] [place] [phone] Accepts children up to age 3 who are enrolled in [place] or [place]; pregnant women with a Medicaid card; and children who have applied for Medicaid or [place] Health Choice, but were declined, whose parents can pay a reduced fee at time of service.  [facility]  [address], [place] [phone] Accepts children up to age 31 who are enrolled in [place] or [place]; pregnant women with a Medicaid card; and children who have applied for Medicaid or [place] Health Choice, but were declined, whose parents can pay a reduced fee at time of service.  [facility] PRO[facility]  [place] [phone] Patients are seen by appointment only. Walk-ins are not accepted. [place] will see patients 63 years of age and  older. Monday - Tuesday (8am-5pm) Most Wednesdays (8:30-5pm) $30 per visit, cash only  [facility] Adult Dental Access PRO[facility]  [address], [facility] [phone] Patients are seen by appointment only. Walk-ins are not accepted. [place] will see patients 73 years of age and older. One Wednesday Evening (Monthly: Volunteer Based).  $30 per visit, cash only  [place]  [phone] for adults; Children under age 103, call Graduate Pediatric Dentistry at [phone]. Children aged  4-14, please call [phone] to request a pediatric application.  Dental services are provided in all areas of dental care including fillings, crowns and bridges, complete and partial dentures, implants, gum treatment, root canals, and extractions. Preventive care is also provided. Treatment is provided to both adults and children. Patients are selected via a lottery and there is often a waiting list.   [facility] [address], [place]  [phone] [email or web address]   [facility] [address] [place], [place] [phone], Ext. [phone] Second and Fourth Thursday of each month, opens at 6:30 AM; Clinic ends at 9 AM.  Patients are seen on a first-come first-served basis, and a limited number are seen during each clinic.   [facility]  [address] [place], [place] [phone]   Eligibility Requirements You must have lived in [place], [place], or [place] counties for at least the last three months.   You cannot be eligible for state or federal sponsored [employer], including [employer], [place], or [employer].   You generally cannot be eligible for healthcare insurance through your employer.    How to apply: Eligibility screenings are held every Tuesday and Wednesday afternoon from 1:00 pm until 4:00 pm. You do not need an appointment for the interview!  [facility] [address]  [place], [place]   [place]  [place] Department  [place]  [phone]    Behavioral Health Resources in the Community: Intensive Outpatient Programs Organization         Address  Phone  Notes  [place]. [address] [place], [place] [phone]   [facility] Outpatient [address], [place], [place]   ADS: Alcohol [facility] Drug Svcs [address], [place], [place]   [place] [address]  [place], [place] or [phone]   Substance Abuse Resources Organization         Address  Phone  Notes  Alcohol and Drug Services  [phone]   [place]  [phone]   [facility] [place]   Chinita Pester  [phone]   Residential & Outpatient Substance Abuse Program  [phone]   Psychological Services Organization         Address  Phone  Notes  [facility] Luis Lopez  [place]  [phone]   [place] [address] [place] or [phone]    Mobile Crisis Teams Organization         Address  Phone  Notes  [facility], Mobile Crisis Care Unit  [phone]   [facility]  [address] [place], [place]   [place] [address], [place] [phone]    Self-Help/Support Groups Organization         Address  Phone             Notes  [place]. of [place] - variety of support groups  [place] Call for more information  Narcotics Anonymous (NA), [facility] [address], [employer] [place]  2 meetings at this location   Special educational needs teacher         Address  Phone  Notes  [facility] [place],    [place]  [phone]   [facility]  [address], [place] [address], [place], [place]   [place], [place] [phone] Admissions: 8am-3pm M-F  Incentives Substance [place] [address],    [place], [place] [phone]   [facility] [address] [place], [place], [place]   [facility] [address],  [place], [place]   [facility] - Intensive Outpatient [address], [place], [place]   [facility] ([place].) [place].,  [place], [place] [phone] or [phone]   [facility] (RTS) [address], [place], [place] Accepts Medicaid  [place] [address]  [place] [phone] Substance Abuse/Addiction Treatment   [facility] Organization         Address  Phone  Notes  [facility]  [phone]   Domenic Schwab, PhD [address] Arlis Porta [place], [place]   [phone] or [phone]   [place], [place] [phone]   [facility] [address] [place], [place] [phone] Insurance/Medicaid/sponsorship through [facility] and Families [address], [address]                                    [place], [place] [phone] [place] [address], [place] [phone]    Dr. Adele Schilder  [phone]   [facility] Dept. 1) [address], [place] 2) [place] 3)  [place] [address], [place] [phone]  [phone]   [place] [phone] or [phone] (After Hours)

## 2014-09-08 NOTE — ED Notes (Addendum)
Pt ate and drank, reports feeling better. C/o pain at iv site, IV removed.

## 2014-09-15 ENCOUNTER — Encounter (HOSPITAL_COMMUNITY): Payer: Self-pay | Admitting: Emergency Medicine

## 2014-09-15 ENCOUNTER — Emergency Department (HOSPITAL_COMMUNITY)
Admission: EM | Admit: 2014-09-15 | Discharge: 2014-09-15 | Disposition: A | Discharge status: Home / Self Care | Payer: Self-pay | Attending: Emergency Medicine | Admitting: Emergency Medicine

## 2014-09-15 DIAGNOSIS — Z8709 Personal history of other diseases of the respiratory system: Secondary | ICD-10-CM | POA: Insufficient documentation

## 2014-09-15 DIAGNOSIS — N390 Urinary tract infection, site not specified: Secondary | ICD-10-CM | POA: Insufficient documentation

## 2014-09-15 DIAGNOSIS — Z720 Tobacco use: Secondary | ICD-10-CM | POA: Insufficient documentation

## 2014-09-15 DIAGNOSIS — Z3202 Encounter for pregnancy test, result negative: Secondary | ICD-10-CM | POA: Insufficient documentation

## 2014-09-15 DIAGNOSIS — Z793 Long term (current) use of hormonal contraceptives: Secondary | ICD-10-CM | POA: Insufficient documentation

## 2014-09-15 DIAGNOSIS — Z792 Long term (current) use of antibiotics: Secondary | ICD-10-CM | POA: Insufficient documentation

## 2014-09-15 DIAGNOSIS — Z79899 Other long term (current) drug therapy: Secondary | ICD-10-CM | POA: Insufficient documentation

## 2014-09-15 DIAGNOSIS — R51 Headache: Secondary | ICD-10-CM | POA: Insufficient documentation

## 2014-09-15 LAB — URINALYSIS, ROUTINE W REFLEX MICROSCOPIC
Glucose, UA: 250 mg/dL — AB
NITRITE: POSITIVE — AB
PH: 5 (ref 5.0–8.0)
Protein, ur: >300 mg/dL — AB
Specific Gravity, Urine: >1.03 — ABNORMAL HIGH (ref 1.005–1.030)
Urobilinogen, UA: >8 mg/dL — ABNORMAL HIGH (ref 0.0–1.0)

## 2014-09-15 LAB — URINE MICROSCOPIC-ADD ON

## 2014-09-15 LAB — POC URINE PREG, ED: Preg Test, Ur: NEGATIVE

## 2014-09-15 MED ORDER — CEFTRIAXONE SODIUM 1 G IJ SOLR
1.0000 g | Freq: Once | INTRAMUSCULAR | Status: AC
  Administered 2014-09-15: 1 g via INTRAMUSCULAR
  Filled 2014-09-15: qty 10

## 2014-09-15 MED ORDER — HYDROCODONE-ACETAMINOPHEN 5-325 MG PO TABS
2.0000 | ORAL_TABLET | Freq: Once | ORAL | Status: AC
  Administered 2014-09-15: 2 via ORAL
  Filled 2014-09-15: qty 2

## 2014-09-15 MED ORDER — PROMETHAZINE HCL 12.5 MG PO TABS
12.5000 mg | ORAL_TABLET | Freq: Once | ORAL | Status: AC
  Administered 2014-09-15: 12.5 mg via ORAL
  Filled 2014-09-15: qty 1

## 2014-09-15 MED ORDER — IBUPROFEN 800 MG PO TABS: 800.0000 mg | ORAL_TABLET | Freq: Three times a day (TID) | ORAL | Status: DC

## 2014-09-15 MED ORDER — CEPHALEXIN 500 MG PO CAPS: 500.0000 mg | ORAL_CAPSULE | Freq: Four times a day (QID) | ORAL | Status: DC

## 2014-09-15 MED ORDER — HYDROCODONE-ACETAMINOPHEN 5-325 MG PO TABS: 1.0000 | ORAL_TABLET | ORAL | Status: DC | PRN

## 2014-09-15 MED ORDER — LIDOCAINE HCL (PF) 1 % IJ SOLN
INTRAMUSCULAR | Status: AC
  Administered 2014-09-15: 5 mL
  Filled 2014-09-15: qty 5

## 2014-09-15 NOTE — ED Notes (Signed)
Patient with no complaints at this time. Respirations even and unlabored. Skin warm/dry. Discharge instructions reviewed with patient at this time. Patient given opportunity to voice concerns/ask questions. Patient discharged at this time and left Emergency Department with steady gait.   

## 2014-09-15 NOTE — ED Provider Notes (Signed)
CSN: [id number]     Arrival date & time 09/15/14  1203 History   First MD Initiated Contact with Patient 09/15/14 1214     Chief Complaint  Patient presents with  . Dysuria     (Consider location/radiation/quality/duration/timing/severity/associated sxs/prior Treatment) Patient is a 29 y.o. female presenting with dysuria. The history is provided by the patient.  Dysuria Pain quality:  Aching (heaviness sensation) Pain severity:  Moderate Onset quality:  Gradual Duration:  1 week Timing:  Intermittent Progression:  Unchanged Chronicity:  New Recent urinary tract infections: yes   Relieved by:  Nothing Ineffective treatments:  Antibiotics and phenazopyridine Urinary symptoms: frequent urination and hematuria   Associated symptoms: flank pain   Associated symptoms: no fever, no nausea and no vomiting   Risk factors: no hx of pyelonephritis and no hx of urolithiasis     Past Medical History  Diagnosis Date  . Drug abuse   . UTI (lower urinary tract infection)   . Bronchitis    Past Surgical History  Procedure Laterality Date  . Multiple tooth extractions     History reviewed. No pertinent family history. History  Substance Use Topics  . Smoking status: Current Every Day Smoker -- 0.50 packs/day for 9 years    Types: Cigarettes  . Smokeless tobacco: Never Used  . Alcohol Use: No   OB History    Gravida Para Term Preterm AB TAB SAB Ectopic Multiple Living            0     Review of Systems  Constitutional: Negative for fever.  Gastrointestinal: Negative for nausea and vomiting.  Genitourinary: Positive for dysuria and flank pain.  Neurological: Positive for headaches.  All other systems reviewed and are negative.     Allergies  Review of patient's allergies indicates no known allergies.  Home Medications   Prior to Admission medications   Medication Sig Start Date End Date Taking? Authorizing Provider  atenolol (TENORMIN) 25 MG tablet Take 25 mg by mouth  daily.   Yes Historical Provider, MD  chlordiazePOXIDE (LIBRIUM) 10 MG capsule Take 10 mg by mouth 2 (two) times daily.   Yes Historical Provider, MD  ciprofloxacin (CIPRO) 500 MG tablet Take 500 mg by mouth 2 (two) times daily.   Yes Historical Provider, MD  gabapentin (NEURONTIN) 400 MG capsule Take 400-800 mg by mouth 2 (two) times daily. 1 in morning and 2 at bedtime   Yes Historical Provider, MD  levonorgestrel-ethinyl estradiol (SEASONALE,INTROVALE,JOLESSA) 0.15-0.03 MG tablet Take 1 tablet by mouth daily.   Yes Historical Provider, MD  phenazopyridine (PYRIDIUM) 200 MG tablet Take 200 mg by mouth 3 (three) times daily as needed for pain.   Yes Historical Provider, MD  ranitidine (ZANTAC) 300 MG tablet Take 300 mg by mouth daily.   Yes Historical Provider, MD   BP 124/87 mmHg  Pulse 90  Temp(Src) 98.5 F (36.9 C) (Oral)  Resp 14  Ht 5\' 1"  (1.549 m)  Wt 150 lb (68.04 kg)  BMI 28.36 kg/m2  SpO2 97% Physical Exam  Constitutional: She is oriented to person, place, and time. She appears well-developed and well-nourished.  Non-toxic appearance.  HENT:  Head: Normocephalic.  Right Ear: Tympanic membrane and external ear normal.  Left Ear: Tympanic membrane and external ear normal.  Eyes: EOM and lids are normal. Pupils are equal, round, and reactive to light.  Neck: Normal range of motion. Neck supple. Carotid bruit is not present.  Cardiovascular: Normal rate, regular rhythm, normal heart sounds,  intact distal pulses and normal pulses.   Pulmonary/Chest: Breath sounds normal. No respiratory distress.  Abdominal: Soft. Bowel sounds are normal. There is tenderness. There is no guarding.  Mild suprapubic tenderness. Mild CVAT left  Musculoskeletal: Normal range of motion.  Lymphadenopathy:       Head (right side): No submandibular adenopathy present.       Head (left side): No submandibular adenopathy present.    She has no cervical adenopathy.  Neurological: She is alert and oriented  to person, place, and time. She has normal strength. No cranial nerve deficit or sensory deficit.  Skin: Skin is warm and dry.  Psychiatric: She has a normal mood and affect. Her speech is normal.  Nursing note and vitals reviewed.   ED Course  Procedures (including critical care time) Labs Review Labs Reviewed  URINALYSIS, ROUTINE W REFLEX MICROSCOPIC (NOT AT [facility]) - Abnormal; Notable for the following:    Color, Urine ORANGE (*)    APPearance HAZY (*)    Specific Gravity, Urine >1.030 (*)    Glucose, UA 250 (*)    Hgb urine dipstick SMALL (*)    Bilirubin Urine MODERATE (*)    Ketones, ur TRACE (*)    Protein, ur >300 (*)    Urobilinogen, UA >8.0 (*)    Nitrite POSITIVE (*)    Leukocytes, UA TRACE (*)    All other components within normal limits  URINE MICROSCOPIC-ADD ON - Abnormal; Notable for the following:    Squamous Epithelial / LPF FEW (*)    Bacteria, UA MANY (*)    All other components within normal limits  URINE CULTURE  POC URINE PREG, ED    Imaging Review No results found.   EKG Interpretation None      MDM  In spite of being on bactrim, UA continues to show evidence of UTI. Pt treated with IM Rocephin and Rx for keflex. Rx for ibuprofen and norco also given. Pt to have urine rechecked in 10 days. She will return to the ED if any changes or problems before the recheck.   Final diagnoses:  UTI (lower urinary tract infection)    **I have reviewed nursing notes, vital signs, and all appropriate lab and imaging results for this patient.Lily Kocher, PA-C 09/17/14 1944  Dorie Rank, MD 09/20/14 [phone]

## 2014-09-15 NOTE — Discharge Instructions (Signed)
Your urine test suggests that you have a urinary tract infection, and there is some mild dehydration present. Please increase your fluids. Please use Keflex 4 times daily with food. Please stop the Cipro for now. Please use ibuprofen for mild soreness and pain and discomfort. Please use Norco for more severe pain and discomfort. Please see your primary physician or return to the emergency department if any persistent fever, nausea vomiting, or deterioration in her general condition. Urinary Tract Infection Urinary tract infections (UTIs) can develop anywhere along your urinary tract. Your urinary tract is your body's drainage system for removing wastes and extra water. Your urinary tract includes two kidneys, two ureters, a bladder, and a urethra. Your kidneys are a pair of bean-shaped organs. Each kidney is about the size of your fist. They are located below your ribs, one on each side of your spine. CAUSES Infections are caused by microbes, which are microscopic organisms, including fungi, viruses, and bacteria. These organisms are so small that they can only be seen through a microscope. Bacteria are the microbes that most commonly cause UTIs. SYMPTOMS  Symptoms of UTIs may vary by age and gender of the patient and by the location of the infection. Symptoms in young women typically include a frequent and intense urge to urinate and a painful, burning feeling in the bladder or urethra during urination. Older women and men are more likely to be tired, shaky, and weak and have muscle aches and abdominal pain. A fever may mean the infection is in your kidneys. Other symptoms of a kidney infection include pain in your back or sides below the ribs, nausea, and vomiting. DIAGNOSIS To diagnose a UTI, your caregiver will ask you about your symptoms. Your caregiver also will ask to provide a urine sample. The urine sample will be tested for bacteria and white blood cells. White blood cells are made by your body to  help fight infection. TREATMENT  Typically, UTIs can be treated with medication. Because most UTIs are caused by a bacterial infection, they usually can be treated with the use of antibiotics. The choice of antibiotic and length of treatment depend on your symptoms and the type of bacteria causing your infection. HOME CARE INSTRUCTIONS  If you were prescribed antibiotics, take them exactly as your caregiver instructs you. Finish the medication even if you feel better after you have only taken some of the medication.  Drink enough water and fluids to keep your urine clear or pale yellow.  Avoid caffeine, tea, and carbonated beverages. They tend to irritate your bladder.  Empty your bladder often. Avoid holding urine for long periods of time.  Empty your bladder before and after sexual intercourse.  After a bowel movement, women should cleanse from front to back. Use each tissue only once. SEEK MEDICAL CARE IF:   You have back pain.  You develop a fever.  Your symptoms do not begin to resolve within 3 days. SEEK IMMEDIATE MEDICAL CARE IF:   You have severe back pain or lower abdominal pain.  You develop chills.  You have nausea or vomiting.  You have continued burning or discomfort with urination. MAKE SURE YOU:   Understand these instructions.  Will watch your condition.  Will get help right away if you are not doing well or get worse. Document Released: 12/11/2004 Document Revised: 09/02/2011 Document Reviewed: 04/11/2011 [facility] Patient Information 2015 [place], [place]. This information is not intended to replace advice given to you by your health care provider. Make sure  you discuss any questions you have with your health care provider.

## 2014-09-15 NOTE — ED Notes (Signed)
Patient complaining of dysuria and hematuria x 5 days. States she was seen by PCP on Wednesday and given cipro and pyridium but is no better.

## 2014-09-18 LAB — URINE CULTURE: Culture: NO GROWTH

## 2014-12-19 ENCOUNTER — Emergency Department (HOSPITAL_COMMUNITY)
Admission: EM | Admit: 2014-12-19 | Discharge: 2014-12-19 | Disposition: A | Discharge status: Home / Self Care | Payer: Self-pay | Attending: Emergency Medicine | Admitting: Emergency Medicine

## 2014-12-19 ENCOUNTER — Encounter (HOSPITAL_COMMUNITY): Payer: Self-pay | Admitting: Emergency Medicine

## 2014-12-19 DIAGNOSIS — J029 Acute pharyngitis, unspecified: Secondary | ICD-10-CM | POA: Insufficient documentation

## 2014-12-19 DIAGNOSIS — Z720 Tobacco use: Secondary | ICD-10-CM | POA: Insufficient documentation

## 2014-12-19 DIAGNOSIS — Z792 Long term (current) use of antibiotics: Secondary | ICD-10-CM | POA: Insufficient documentation

## 2014-12-19 DIAGNOSIS — Z87440 Personal history of urinary (tract) infections: Secondary | ICD-10-CM | POA: Insufficient documentation

## 2014-12-19 DIAGNOSIS — Z79899 Other long term (current) drug therapy: Secondary | ICD-10-CM | POA: Insufficient documentation

## 2014-12-19 DIAGNOSIS — Z793 Long term (current) use of hormonal contraceptives: Secondary | ICD-10-CM | POA: Insufficient documentation

## 2014-12-19 DIAGNOSIS — J069 Acute upper respiratory infection, unspecified: Secondary | ICD-10-CM | POA: Insufficient documentation

## 2014-12-19 HISTORY — DX: Headache: R51

## 2014-12-19 HISTORY — DX: Headache, unspecified: R51.9

## 2014-12-19 LAB — RAPID STREP SCREEN (MED CTR MEBANE ONLY): STREPTOCOCCUS, GROUP A SCREEN (DIRECT): NEGATIVE

## 2014-12-19 MED ORDER — HYDROCODONE-IBUPROFEN 7.5-200 MG PO TABS: 1.0000 | ORAL_TABLET | Freq: Four times a day (QID) | ORAL | Status: DC | PRN

## 2014-12-19 MED ORDER — IBUPROFEN 400 MG PO TABS
400.0000 mg | ORAL_TABLET | Freq: Once | ORAL | Status: DC
  Filled 2014-12-19: qty 1

## 2014-12-19 MED ORDER — DEXAMETHASONE 10 MG/ML FOR PEDIATRIC ORAL USE
10.0000 mg | Freq: Once | INTRAMUSCULAR | Status: AC
  Administered 2014-12-19: 10 mg via ORAL
  Filled 2014-12-19: qty 1

## 2014-12-19 NOTE — ED Notes (Signed)
Pt verbalized understanding of no driving and to use caution within 4 hours of taking pain meds due to meds cause drowsiness 

## 2014-12-19 NOTE — ED Notes (Signed)
Pt c/o of nausea, vomiting, sore throat, and hoarseness since Friday. Pt states she feels like she is wheezing. NAD noted. VSS.

## 2014-12-19 NOTE — Discharge Instructions (Signed)
°Emergency Department Resource Guide °1) Find a Doctor and Pay Out of Pocket °Although you won't have to find out who is covered by your insurance plan, it is a good idea to ask around and get recommendations. You will then need to call the office and see if the doctor you have chosen will accept you as a new patient and what types of options they offer for patients who are self-pay. Some doctors offer discounts or will set up payment plans for their patients who do not have insurance, but you will need to ask so you aren't surprised when you get to your appointment. ° °2) Contact Your Local Health Department °Not all health departments have doctors that can see patients for sick visits, but many do, so it is worth a call to see if yours does. If you don't know where your local health department is, you can check in your phone book. The CDC also has a tool to help you locate your state's health department, and many state websites also have listings of all of their local health departments. ° °3) Find a Walk-in Clinic °If your illness is not likely to be very severe or complicated, you may want to try a walk in clinic. These are popping up all over the country in pharmacies, drugstores, and shopping centers. They're usually staffed by nurse practitioners or physician assistants that have been trained to treat common illnesses and complaints. They're usually fairly quick and inexpensive. However, if you have serious medical issues or chronic medical problems, these are probably not your best option. ° °No Primary Care Doctor: °- Call [facility] at  832-8000 - they can help you locate a primary care doctor that  accepts your insurance, provides certain services, etc. °- Physician Referral Service- 1-800-533-3463 ° °Chronic Pain Problems: °Organization         Address  Phone   Notes  °[facility]  (336) [phone] Patients need to be referred by their primary care doctor.  ° °Medication  Assistance: °Organization         Address  Phone   Notes  °[place] [facility] [address] °[place], [place] [address] (336) 641-8030 --Must be a resident of [place] °-- Must have NO insurance coverage whatsoever (no Medicaid/ Medicare, etc.) °-- The pt. MUST have a primary care doctor that directs their care regularly and follows them in the community °  °[facility]  (866) 331-1348   °[employer]  (888) 892-1162   ° °Agencies that provide inexpensive medical care: °Organization         Address  Phone   Notes  °[facility]  (336) 832-8035   °[facility]    (336) 832-7272   °[facility] [address] °[place], [place] [address] (336) 832-4777   °[facility] [address], °[place] (336) 271-4999   °[facility]    (336) 373-0678   °[facility]    (336) 272-1050   °[facility] ° [address], [place] Phone:  (336) 832-4444, Fax:  (336) 832-4440 Hours of Operation:  9 am - 6 pm, M-F.  Also accepts Medicaid/Medicare and self-pay.  ° [facility] ° [address], [place] Phone: (336) [phone], Fax: (336) [phone]. Hours of Operation:  8:30 am - 5:30 pm, M-F.  Also accepts Medicaid and self-pay.  °[facility] [place] [address], [place] Phone: (336) 878-6027   °[facility] [address], [place], [place] (336)723-1848, Ext. 123 Mondays & Thursdays: 7-9 AM.  First 15 patients are seen on a first come, first serve basis. °  ° °Medicaid-accepting [place] Providers: ° °Organization         Address  Phone   Notes  °[facility] [address], [place] (336) 641-2100 Also accepts self-pay patients.  °[facility] [address], [place] ° (336) 856-9996   °[facility] [address], [place]  (336) 288-8857   °[facility] [address] [place] [address], [place] (336) 299-7000   °Veita Bland [address], [place]  ° (336) 373-1557 Only accepts [place] Access Medicaid patients after they have their name applied to their card.  ° °Self-Pay (no insurance) in [place]: ° °Organization         Address  Phone   Notes  °Sickle Cell Patients, [facility] [address], [place] (336) 832-1970   °[facility] [address], [place] (336) 832-4400   °[facility] ° [address], [place] (336) 992-4800   °[facility]/Dr. Osei-Bonsu ° [address] [place] [address], [place] or [address], [place] (336) [phone] Phone number for both [place] and [place] locations is the same.  °[facility] [address], [place] (336) 299-0000   °[facility] [address] [place] [address], [place] or [address] (336) 852-7530 °(336) 878-2260   °[facility] [address], [place] (336) 350-1642, phone; (336) 294-5005, fax Sees patients 1st and 3rd Saturday of every month.  Must not qualify for public or private insurance (i.e. Medicaid, Medicare, [place] Health Choice, Veterans' Benefits) • Household income should be no more than 200% of the poverty level •The clinic cannot treat you if you are pregnant or think you are pregnant • Sexually transmitted diseases are not treated at the clinic.  ° ° °Dental Care: °Organization         Address  Phone  Notes  °[place] [facility] [address], [place] (336) 641-6152 Accepts children up to age 21 who are enrolled in Medicaid or [place] Health Choice; pregnant women with a Medicaid card; and children who have applied for Medicaid or [place] Health Choice, but were declined, whose parents can pay a reduced fee at time of service.  °[place]  [facility] [place]  [address], [place] (336) 641-7733 Accepts children up to age 21 who are enrolled in Medicaid or [place] Health Choice; pregnant women with a Medicaid card; and children who have applied for Medicaid or [place] Health Choice, but were declined, whose parents can pay a reduced fee at time of service.  °[facility] ° [address],  (336) 641-4533 Patients are seen by appointment only. Walk-ins are not accepted. [facility] will see patients 18 years of age and older. °Monday - Tuesday (8am-5pm) °Most Wednesdays (8:30-5pm) °$30 per visit, cash only  °[facility] ° [address], [place] (336) 641-4533 Patients are seen by appointment only. Walk-ins are not accepted. [facility] will see patients 18 years of age and older. °One   Wednesday Evening (Monthly: Volunteer Based).  $30 per visit, cash only  °[facility]  (919) [phone] for adults; Children under age 4, call Graduate Pediatric Dentistry at ([phone]) [phone]. Children aged 4-14, please call ([phone]) [phone] to request a pediatric application. ° Dental services are provided in all areas of dental care including fillings, crowns and bridges, complete and partial dentures, implants, gum treatment, root canals, and extractions. Preventive care is also provided. Treatment is provided to both adults and children. °Patients are selected via a lottery and there is often a waiting list. °  °[facility] [address], °[place] ° (336) 763-8833 [email or web address] °  °[facility] [address], [place], [place] (336)723-1848, Ext. 123 Second and Fourth Thursday of each month, opens at 6:30 AM; Clinic ends at 9 AM.  Patients are seen on a first-come first-served basis, and a limited number are seen during each clinic.  ° °[facility] ° [address], [place], [place] (336) [phone]    Eligibility Requirements °You must have lived in [place], [place], or [place] counties for at least the last three months. °  You cannot be eligible for state or federal sponsored healthcare insurance, including Veterans Administration, Medicaid, or Medicare. °  You generally cannot be eligible for healthcare insurance through your employer.  °  How to apply: °Eligibility screenings are held every Tuesday and Wednesday afternoon from 1:00 pm until 4:00 pm. You do not need an appointment for the interview!  °[facility] [address], [place], [place] 336-631-2330   °[facility]  336-342-8273   °[facility]  336-703-3100   °[facility]  336-570-6415   ° °Behavioral Health Resources in the Community: °Intensive Outpatient Programs °Organization         Address  Phone  Notes  °[facility] [address], [place], [place] 336-878-6098   °[facility] [address], [place], [place] 336-832-9800   °[facility] [address], [place], [place] ° 336-882-2125   °[facility] [address],  °[place], [place] 1-800-853-5163 or 336-641-4981   °Substance Abuse Resources °Organization         Address  Phone  Notes  °[facility]  336-882-2125   °[facility]  336-784-9470   °[facility]  336-285-9073   °[facility]  336-845-3988   °Residential & Outpatient Substance Abuse Program  1-800-659-3381   °Psychological Services °Organization         Address  Phone  Notes  °[facility]  336- 832-9600   °[facility]  336- 378-7881   °[facility] [address], [place] 1-800-853-5163 or 336-641-4981   ° °Mobile Crisis Teams °Organization         Address  Phone  Notes  °[facility], Mobile Crisis Care Unit  1-877-626-1772   °Assertive °[facility] ° [address]  [place], [place]  336-834-9664   °Sharon DeEsch [address] °[place] 336-554-5454   ° °Self-Help/Support Groups °Organization         Address  Phone             Notes  °[facility] [place] - variety of support groups  336- 373-1402 Call for more information  °Narcotics Anonymous (NA), [facility] [address], °[place]  2 meetings at this location  ° °  Residential Treatment Programs Organization         Address  Phone  Notes  [facility] [address],    [place]  1-408-096-9291   [facility]  [address], [place] [address], [place], [place]   [place], [place] (743)744-6956 Admissions: 8am-3pm M-F  [facility] [place] [address],    [place], [place] [phone]   [facility] [address] [place], [place], [place]   [facility] [address],  [place], [place]   [facility] - Intensive Outpatient [address], [place], [place]   [facility] ([place] [place]  [place], [place] 1-(256) 450-2708 or 878-106-8551   [facility] [address], [place], [place] Accepts Medicaid  [facility] [address],  [place] 1-727-682-0937 Substance Abuse/Addiction Treatment   [facility] Organization         Address  Phone  Notes  [facility]  724-154-5897   Domenic Schwab, PhD [address] [place], [place]   251-092-7448 or 760-047-4155   [facility] [place], [place] [phone]   [facility] [address], [place], [place] [phone] Insurance/Medicaid/sponsorship through [facility] [address] [place]                                    [place], [place] [phone] [place] [address], [place] [phone]    Dr. Adele Schilder  [phone]   [facility] Dept. 1) [address], [place] 2) [place] 3)  [address], [place] [phone]  [phone]   [place] 339-744-0612 or [phone] (After Hours)      Take over the counter decongestant (such as sudafed), as directed on packaging, for the next week.  Use over the counter normal saline nasal spray, as instructed in the Emergency Department, several times per day for the next 2 weeks. Gargle with warm water several times per day to help with discomfort.  May also use over the counter sore throat pain medicines such as chloraseptic or sucrets, as directed on packaging, as needed for discomfort.  Call your regular medical doctor tomorrow to schedule a follow up appointment in the next 2 days.  Return to the Emergency Department immediately if worsening.

## 2014-12-19 NOTE — ED Provider Notes (Signed)
CSN: [id number]     Arrival date & time 12/19/14  1735 History   First MD Initiated Contact with Patient 12/19/14 1901     Chief Complaint  Patient presents with  . Sore Throat      HPI Pt was seen at 1910.  Per pt, c/o gradual onset and persistence of constant sore throat, runny/stuffy nose, post-nasal drip, and sinus congestion for the past 2 days. States 4 days ago she had N/V/D, since resolved. Denies fevers, no rash, no dysphagia, no CP/SOB, no further N/V/D, no abd pain.     Past Medical History  Diagnosis Date  . Drug abuse   . UTI (lower urinary tract infection)   . Bronchitis   . Headache   . Overdose     "I overdosed on tylenol as a kid"   Past Surgical History  Procedure Laterality Date  . Multiple tooth extractions      Social History  Substance Use Topics  . Smoking status: Current Every Day Smoker -- 0.50 packs/day for 9 years    Types: Cigarettes  . Smokeless tobacco: Never Used  . Alcohol Use: No     Comment: occ     Review of Systems ROS: Statement: All systems negative except as marked or noted in the HPI; Constitutional: Negative for fever and chills. ; ; Eyes: Negative for eye pain, redness and discharge. ; ; ENMT: Negative for ear pain, hoarseness, +nasal congestion, +sinus pressure, +sore throat. ; ; Cardiovascular: Negative for chest pain, palpitations, diaphoresis, dyspnea and peripheral edema. ; ; Respiratory: Negative for cough, wheezing and stridor. ; ; Gastrointestinal: Negative for nausea, vomiting, diarrhea, abdominal pain, blood in stool, hematemesis, jaundice and rectal bleeding. . ; ; Genitourinary: Negative for dysuria, flank pain and hematuria. ; ; Musculoskeletal: Negative for back pain and neck pain. Negative for swelling and trauma.; ; Skin: Negative for pruritus, rash, abrasions, blisters, bruising and skin lesion.; ; Neuro: Negative for headache, lightheadedness and neck stiffness. Negative for weakness, altered level of consciousness ,  altered mental status, extremity weakness, paresthesias, involuntary movement, seizure and syncope.      Allergies  Review of patient's allergies indicates no known allergies.  Home Medications   Prior to Admission medications   Medication Sig Start Date End Date Taking? Authorizing Provider  atenolol (TENORMIN) 25 MG tablet Take 25 mg by mouth daily.    Historical Provider, MD  cephALEXin (KEFLEX) 500 MG capsule Take 1 capsule (500 mg total) by mouth 4 (four) times daily. Patient not taking: Reported on 12/19/2014 09/15/14   Lily Kocher, PA-C  chlordiazePOXIDE (LIBRIUM) 10 MG capsule Take 10 mg by mouth 2 (two) times daily.    Historical Provider, MD  ciprofloxacin (CIPRO) 500 MG tablet Take 500 mg by mouth 2 (two) times daily.    Historical Provider, MD  gabapentin (NEURONTIN) 400 MG capsule Take 400-800 mg by mouth 2 (two) times daily. 1 in morning and 2 at bedtime    Historical Provider, MD  HYDROcodone-acetaminophen (NORCO/VICODIN) 5-325 MG per tablet Take 1 tablet by mouth every 4 (four) hours as needed. Patient not taking: Reported on 12/19/2014 09/15/14   Lily Kocher, PA-C  ibuprofen (ADVIL,MOTRIN) 800 MG tablet Take 1 tablet (800 mg total) by mouth 3 (three) times daily. Patient not taking: Reported on 12/19/2014 09/15/14   Lily Kocher, PA-C  levonorgestrel-ethinyl estradiol (SEASONALE,INTROVALE,JOLESSA) 0.15-0.03 MG tablet Take 1 tablet by mouth daily.    Historical Provider, MD  phenazopyridine (PYRIDIUM) 200 MG tablet Take 200 mg  by mouth 3 (three) times daily as needed for pain.    Historical Provider, MD  ranitidine (ZANTAC) 300 MG tablet Take 300 mg by mouth daily.    Historical Provider, MD   BP 132/91 mmHg  Pulse 86  Temp(Src) 98.6 F (37 C) (Oral)  Resp 18  Ht 5\' 1"  (1.549 m)  Wt 150 lb (68.04 kg)  BMI 28.36 kg/m2  SpO2 100%  LMP 10/08/2014 Physical Exam 1915: Physical examination:  Nursing notes reviewed; Vital signs and O2 SAT reviewed;  Constitutional: Well  developed, Well nourished, Well hydrated, In no acute distress; Head:  Normocephalic, atraumatic; Eyes: EOMI, PERRL, No scleral icterus; ENMT: TM's clear bilat. +edemetous nasal turbinates bilat with clear rhinorrhea. Mouth and pharynx without lesions. No tonsillar exudates. No intra-oral edema. No submandibular or sublingual edema. +mild intermittent hoarse voice. No drooling, no stridor. No pain with manipulation of larynx. No trismus. Mouth and pharynx normal, Mucous membranes moist; Neck: Supple, Full range of motion, No lymphadenopathy; Cardiovascular: Regular rate and rhythm, No murmur, rub, or gallop; Respiratory: Breath sounds clear & equal bilaterally, No rales, rhonchi, wheezes.  Speaking full sentences with ease, Normal respiratory effort/excursion; Chest: Nontender, Movement normal; Abdomen: Soft, Nontender, Nondistended, Normal bowel sounds; Genitourinary: No CVA tenderness; Extremities: Pulses normal, No tenderness, No edema, No calf edema or asymmetry.; Neuro: AA&Ox3, Major CN grossly intact.  Speech clear. No gross focal motor or sensory deficits in extremities.; Skin: Color normal, Warm, Dry.   ED Course  Procedures (including critical care time) Labs Review   Imaging Review  I have personally reviewed and evaluated these images and lab results as part of my medical decision-making.   EKG Interpretation None      MDM  MDM Reviewed: previous chart, nursing note and vitals Interpretation: labs      Results for orders placed or performed during the hospital encounter of 12/19/14  Rapid strep screen  Result Value Ref Range   Streptococcus, Group A Screen (Direct) NEGATIVE NEGATIVE     2040:  Pt given dose of decadron and motrin (refused tylenol "because I can't take it").  Pt has tol PO well while in the ED without N/V. Rapid strep test negative; culture pending. Tx symptomatically at this time. Pt requesting work note. Dx and testing d/w pt.  Questions answered.  Verb  understanding, agreeable to d/c home with outpt f/u.    Francine Graven, DO 12/22/14 1659

## 2014-12-23 LAB — CULTURE, GROUP A STREP: Strep A Culture: NEGATIVE

## 2015-01-02 ENCOUNTER — Emergency Department (HOSPITAL_COMMUNITY): Payer: Self-pay

## 2015-01-02 ENCOUNTER — Emergency Department (HOSPITAL_COMMUNITY)
Admission: EM | Admit: 2015-01-02 | Discharge: 2015-01-02 | Disposition: A | Discharge status: Home / Self Care | Payer: Self-pay | Attending: Emergency Medicine | Admitting: Emergency Medicine

## 2015-01-02 ENCOUNTER — Encounter (HOSPITAL_COMMUNITY): Payer: Self-pay | Admitting: Emergency Medicine

## 2015-01-02 DIAGNOSIS — Z793 Long term (current) use of hormonal contraceptives: Secondary | ICD-10-CM | POA: Insufficient documentation

## 2015-01-02 DIAGNOSIS — J40 Bronchitis, not specified as acute or chronic: Secondary | ICD-10-CM

## 2015-01-02 DIAGNOSIS — J9801 Acute bronchospasm: Secondary | ICD-10-CM

## 2015-01-02 DIAGNOSIS — R0789 Other chest pain: Secondary | ICD-10-CM

## 2015-01-02 DIAGNOSIS — Z87440 Personal history of urinary (tract) infections: Secondary | ICD-10-CM | POA: Insufficient documentation

## 2015-01-02 DIAGNOSIS — J209 Acute bronchitis, unspecified: Secondary | ICD-10-CM | POA: Insufficient documentation

## 2015-01-02 DIAGNOSIS — Z792 Long term (current) use of antibiotics: Secondary | ICD-10-CM | POA: Insufficient documentation

## 2015-01-02 DIAGNOSIS — J040 Acute laryngitis: Secondary | ICD-10-CM | POA: Insufficient documentation

## 2015-01-02 DIAGNOSIS — J329 Chronic sinusitis, unspecified: Secondary | ICD-10-CM | POA: Insufficient documentation

## 2015-01-02 DIAGNOSIS — Z720 Tobacco use: Secondary | ICD-10-CM | POA: Insufficient documentation

## 2015-01-02 DIAGNOSIS — Z79899 Other long term (current) drug therapy: Secondary | ICD-10-CM | POA: Insufficient documentation

## 2015-01-02 MED ORDER — ALBUTEROL SULFATE (2.5 MG/3ML) 0.083% IN NEBU: 5.0000 mg | INHALATION_SOLUTION | Freq: Once | RESPIRATORY_TRACT | Status: DC

## 2015-01-02 MED ORDER — IPRATROPIUM BROMIDE 0.02 % IN SOLN: 0.5000 mg | Freq: Once | RESPIRATORY_TRACT | Status: DC

## 2015-01-02 MED ORDER — IBUPROFEN 800 MG PO TABS: 800.0000 mg | ORAL_TABLET | Freq: Three times a day (TID) | ORAL | Status: DC

## 2015-01-02 MED ORDER — AMOXICILLIN 500 MG PO CAPS: 500.0000 mg | ORAL_CAPSULE | Freq: Three times a day (TID) | ORAL | Status: DC

## 2015-01-02 MED ORDER — ALBUTEROL SULFATE (2.5 MG/3ML) 0.083% IN NEBU
2.5000 mg | INHALATION_SOLUTION | Freq: Once | RESPIRATORY_TRACT | Status: AC
  Administered 2015-01-02: 2.5 mg via RESPIRATORY_TRACT
  Filled 2015-01-02: qty 3

## 2015-01-02 MED ORDER — IPRATROPIUM-ALBUTEROL 0.5-2.5 (3) MG/3ML IN SOLN
3.0000 mL | Freq: Once | RESPIRATORY_TRACT | Status: AC
  Administered 2015-01-02: 3 mL via RESPIRATORY_TRACT
  Filled 2015-01-02: qty 3

## 2015-01-02 MED ORDER — DEXAMETHASONE SODIUM PHOSPHATE 10 MG/ML IJ SOLN
10.0000 mg | Freq: Once | INTRAMUSCULAR | Status: AC
  Administered 2015-01-02: 10 mg via INTRAMUSCULAR
  Filled 2015-01-02: qty 1

## 2015-01-02 NOTE — Discharge Instructions (Signed)
Drink plenty of fluids. Take the antibiotic until gone. Use the inhaler for wheezing or shortness of breath. Take the ibuprofen for pain. TRY TO STOP SMOKING!!! Try heat on your face for comfort. Recheck if you get a high fever, struggle to breathe or feel worse.  Chest Wall Pain Chest wall pain is pain in or around the bones and muscles of your chest. Sometimes, an injury causes this pain. Sometimes, the cause may not be known. This pain may take several weeks or longer to get better. HOME CARE Pay attention to any changes in your symptoms. Take these actions to help with your pain:  Rest as told by your doctor.  Avoid activities that cause pain. Try not to use your chest, belly (abdominal), or side muscles to lift heavy things.  If directed, apply ice to the painful area:  Put ice in a plastic bag.  Place a towel between your skin and the bag.  Leave the ice on for 20 minutes, 2-3 times per day.  Take over-the-counter and prescription medicines only as told by your doctor.  Do not use tobacco products, including cigarettes, chewing tobacco, and e-cigarettes. If you need help quitting, ask your doctor.  Keep all follow-up visits as told by your doctor. This is important. GET HELP IF:  You have a fever.  Your chest pain gets worse.  You have new symptoms. GET HELP RIGHT AWAY IF:  You feel sick to your stomach (nauseous) or you throw up (vomit).  You feel sweaty or light-headed.  You have a cough with phlegm (sputum) or you cough up blood.  You are short of breath.   This information is not intended to replace advice given to you by your health care provider. Make sure you discuss any questions you have with your health care provider.   Document Released: 08/20/2007 Document Revised: 11/22/2014 Document Reviewed: 05/29/2014 Elsevier Interactive Patient Education 2016 [employer].  Sinusitis, Adult Sinusitis is redness, soreness, and puffiness (inflammation) of the air  pockets in the bones of your face (sinuses). The redness, soreness, and puffiness can cause air and mucus to get trapped in your sinuses. This can allow germs to grow and cause an infection.  HOME CARE   Drink enough fluids to keep your pee (urine) clear or pale yellow.  Use a humidifier in your home.  Run a hot shower to create steam in the bathroom. Sit in the bathroom with the door closed. Breathe in the steam 3-4 times a day.  Put a warm, moist washcloth on your face 3-4 times a day, or as told by your doctor.  Use salt water sprays (saline sprays) to wet the thick fluid in your nose. This can help the sinuses drain.  Only take medicine as told by your doctor. GET HELP RIGHT AWAY IF:   Your pain gets worse.  You have very bad headaches.  You are sick to your stomach (nauseous).  You throw up (vomit).  You are very sleepy (drowsy) all the time.  Your face is puffy (swollen).  Your vision changes.  You have a stiff neck.  You have trouble breathing. MAKE SURE YOU:   Understand these instructions.  Will watch your condition.  Will get help right away if you are not doing well or get worse.   This information is not intended to replace advice given to you by your health care provider. Make sure you discuss any questions you have with your health care provider.   Document  Released: 08/20/2007 Document Revised: 03/24/2014 Document Reviewed: 10/07/2011 Elsevier Interactive Patient Education [employer].

## 2015-01-02 NOTE — ED Notes (Signed)
resp paged for breathing tx,

## 2015-01-02 NOTE — ED Notes (Signed)
Pt walking down hall, states " I am going outside" advised pt that she was only waiting on her chest xray results and that they should be back soon, and that she would need to stay in room, pt becomes upset with staff, agrees to go back to room.

## 2015-01-02 NOTE — ED Notes (Addendum)
Pt came to desk and stated to Dr. Marin Comment "I don't know if it's lunch or what but no body has been in to see me.  Can I get a blanket or something?"  Provided pt with blanket and stated "your nurse and Dr Tomi Bamberger will be in to see you shortly".  Pt states "Yeah that's what they said half an hour ago".  Informed Fabio Neighbors, RN pt states no one has been in to see her and is unhappy with wait.

## 2015-01-02 NOTE — ED Notes (Signed)
In room to speak with pt, pt upset with wait time, explained to pt that ER had one EDP and that Dr Tomi Bamberger had already signed up to see her, pt states "there were only 7 cars in the parking lot when I pulled in", explained to pt that the er was actually full with pt's, pt requesting to see another edp, explained to pt that Dr Tomi Bamberger was the only edp that was on duty tonight, pt requesting additional warm blanket, blanket provided, pt states that she is willing to stay and be seen by Dr Tomi Bamberger, Dr Tomi Bamberger notified,

## 2015-01-02 NOTE — ED Notes (Signed)
Pt updated on plan of care,  

## 2015-01-02 NOTE — ED Notes (Signed)
At nurses station talking to Dr. Tomi Bamberger about another pt when Ms. Lengyel approached desk.  After identifying pt, Dr. Tomi Bamberger informed her that she was seeing her next but that she understood if she did not want to wait.  Pt states "I have been waiting for a long time"  To which Dr. Tomi Bamberger replied "I am going to see you next.  I am the only ER Dr. Here right now and we have been busy since I got her, but if you want to leave I understand."  Pt then accused Dr. Tomi Bamberger of "getting an attitude".  Dr Tomi Bamberger stated "I am not getting an attitude, I'm just telling you I was going to see you next but if you want to leave I understand".  Pt then returned to room

## 2015-01-02 NOTE — ED Notes (Signed)
Dr Knapp at bedside,  

## 2015-01-02 NOTE — ED Provider Notes (Signed)
CSN: [id number]     Arrival date & time 01/02/15  0206 History   First MD Initiated Contact with Patient 01/02/15 0406    Chief Complaint  Patient presents with  . Facial Pain     (Consider location/radiation/quality/duration/timing/severity/associated sxs/prior Treatment) HPI patient reports she was seen in the ED about 11 days ago and diagnosed with pharyngitis and URI. She reports she was having hoarseness at that time. She states she was given Vicoprofen and the steroid injection. She states she continues to have cough which now is productive of yellow-green sputum. She is having yellow, green and sometimes bloody rhinorrhea. She has pressure in her head and inner ears. She points over her maxillary sinuses. She states she has pain in the center of her chest that is aching constantly however it gets sharp when she breathes or coughs. She denies having known fever but states she's been having chills for the last couple days. She still has her sore throat and still has some hoarseness. She states she's been having wheezing and her inhaler is not helping. She denies being around anybody else is sick.  PCP [facility] Department   Past Medical History  Diagnosis Date  . Drug abuse   . UTI (lower urinary tract infection)   . Bronchitis   . Headache   . Overdose     "I overdosed on tylenol as a kid"   Past Surgical History  Procedure Laterality Date  . Multiple tooth extractions     History reviewed. No pertinent family history. Social History  Substance Use Topics  . Smoking status: Current Every Day Smoker -- 0.50 packs/day for 9 years    Types: Cigarettes  . Smokeless tobacco: Never Used  . Alcohol Use: No     Comment: occ   employed Smokes 1 1/2 ppd  OB History    Gravida Para Term Preterm AB TAB SAB Ectopic Multiple Living            0     Review of Systems  All other systems reviewed and are negative.     Allergies  Review of patient's allergies  indicates no known allergies.  Home Medications    Prior to Admission medications   Medication Sig Start Date End Date Taking? Authorizing Provider  atenolol (TENORMIN) 25 MG tablet Take 25 mg by mouth daily.   Yes Historical Provider, MD  chlordiazePOXIDE (LIBRIUM) 10 MG capsule Take 25 mg by mouth 3 (three) times daily.    Yes Historical Provider, MD  cloNIDine (CATAPRES) 0.1 MG tablet Take 0.1 mg by mouth 2 (two) times daily.   Yes Historical Provider, MD  gabapentin (NEURONTIN) 400 MG capsule Take 400-800 mg by mouth 2 (two) times daily. 1 in morning and 2 at bedtime   Yes Historical Provider, MD  HYDROcodone-ibuprofen (VICOPROFEN) 7.5-200 MG tablet Take 1 tablet by mouth every 6 (six) hours as needed for moderate pain. 12/19/14  Yes Francine Graven, DO  levonorgestrel-ethinyl estradiol (SEASONALE,INTROVALE,JOLESSA) 0.15-0.03 MG tablet Take 1 tablet by mouth daily.   Yes Historical Provider, MD  ranitidine (ZANTAC) 300 MG tablet Take 300 mg by mouth daily.   Yes Historical Provider, MD  amoxicillin (AMOXIL) 500 MG capsule Take 1 capsule (500 mg total) by mouth 3 (three) times daily. 01/02/15   Rolland Porter, MD  cephALEXin (KEFLEX) 500 MG capsule Take 1 capsule (500 mg total) by mouth 4 (four) times daily. Patient not taking: Reported on 12/19/2014 09/15/14   Lily Kocher, PA-C  ciprofloxacin (CIPRO) 500 MG tablet Take 500 mg by mouth 2 (two) times daily.    Historical Provider, MD  HYDROcodone-acetaminophen (NORCO/VICODIN) 5-325 MG per tablet Take 1 tablet by mouth every 4 (four) hours as needed. Patient not taking: Reported on 12/19/2014 09/15/14   Lily Kocher, PA-C  ibuprofen (ADVIL,MOTRIN) 800 MG tablet Take 1 tablet (800 mg total) by mouth 3 (three) times daily. 01/02/15   Rolland Porter, MD  phenazopyridine (PYRIDIUM) 200 MG tablet Take 200 mg by mouth 3 (three) times daily as needed for pain.    Historical Provider, MD   BP 122/72 mmHg  Pulse 84  Temp(Src) 97.9 F (36.6 C) (Oral)  Resp 20   Ht 5\' 1"  (1.549 m)  Wt 135 lb (61.236 kg)  BMI 25.52 kg/m2  SpO2 100%  LMP 12/30/2014  Vital signs normal   Physical Exam  Constitutional: She is oriented to person, place, and time. She appears well-developed and well-nourished.  Non-toxic appearance. She does not appear ill. No distress.  HENT:  Head: Normocephalic and atraumatic.  Right Ear: External ear normal.  Left Ear: External ear normal.  Nose: Nose normal. No mucosal edema or rhinorrhea.  Mouth/Throat: Oropharynx is clear and moist and mucous membranes are normal. No dental abscesses or uvula swelling.  Tender over her frontal and maxillary sinuses, she is more tender over the right frontal then the maxillary sinuses. Her TMs are clear bilaterally with some scar tissue on the left TM looks like prior tube placement although she denies prior tubes as a child. Patient noted to be hoarse. When she breathes she has rattling that's audible.  Eyes: Conjunctivae and EOM are normal. Pupils are equal, round, and reactive to light.  Neck: Normal range of motion and full passive range of motion without pain. Neck supple.  Cardiovascular: Normal rate, regular rhythm and normal heart sounds.  Exam reveals no gallop and no friction rub.   No murmur heard. Pulmonary/Chest: Effort normal. No respiratory distress. She has no wheezes. She has rhonchi. She has no rales. She exhibits no tenderness and no crepitus.  Abdominal: Soft. Normal appearance and bowel sounds are normal. She exhibits no distension. There is no tenderness. There is no rebound and no guarding.  Musculoskeletal: Normal range of motion. She exhibits no edema or tenderness.  Moves all extremities well.   Lymphadenopathy:    She has cervical adenopathy.  Neurological: She is alert and oriented to person, place, and time. She has normal strength. No cranial nerve deficit.  Skin: Skin is warm, dry and intact. No rash noted. No erythema. No pallor.  Psychiatric: She has a normal  mood and affect. Her speech is normal and behavior is normal. Her mood appears not anxious.  Nursing note and vitals reviewed.   ED Course  Procedures (including critical care time)  Medications  dexamethasone (DECADRON) injection 10 mg (not administered)  ipratropium-albuterol (DUONEB) 0.5-2.5 (3) MG/3ML nebulizer solution 3 mL (3 mLs Nebulization Given 01/02/15 0434)  albuterol (PROVENTIL) (2.5 MG/3ML) 0.083% nebulizer solution 2.5 mg (2.5 mg Nebulization Given 01/02/15 0434)   Recheck 5:25 am states her breathing is better, does not want another nebulizer. States she has an inhaler from her dad to use. I have looked at her CXR, waiting for radiology to read it. She has ? Infiltrate in RLE.  I called radiology to discuss her x-ray which they state is normal. Patient is requesting a prescription for ibuprofen 800 mg which she states works better than taking 4 of the  200 mg tablets over-the-counter.  Imaging Review Dg Chest 2 View  01/02/2015  CLINICAL DATA:  Acute onset of cough and chills.  Initial encounter. EXAM: CHEST  2 VIEW COMPARISON:  Chest radiograph from 11/29/2012 FINDINGS: The lungs are well-aerated and clear. There is no evidence of focal opacification, pleural effusion or pneumothorax. The heart is normal in size; the mediastinal contour is within normal limits. No acute osseous abnormalities are seen. IMPRESSION: No acute cardiopulmonary process seen. Electronically Signed   By: Garald Balding M.D.   On: 01/02/2015 05:47   I have personally reviewed and evaluated these images and lab results as part of my medical decision-making.   EKG Interpretation None      MDM   Final diagnoses:  Bronchitis  Sinusitis, unspecified chronicity, unspecified location  Chest wall pain  Laryngitis  Bronchospasm   New Prescriptions   AMOXICILLIN (AMOXIL) 500 MG CAPSULE    Take 1 capsule (500 mg total) by mouth 3 (three) times daily.   IBUPROFEN (ADVIL,MOTRIN) 800 MG TABLET    Take 1  tablet (800 mg total) by mouth 3 (three) times daily.    .Plan discharge  Rolland Porter, MD, Barbette Or, MD 01/02/15 [phone]

## 2015-01-02 NOTE — ED Notes (Signed)
Pt refusing im injection, per Dr Tomi Bamberger, decardon can be mixed with juice and given po.

## 2015-01-02 NOTE — ED Notes (Signed)
Pt c/o cough, upper back pain, and facial pressure.

## 2015-01-08 ENCOUNTER — Ambulatory Visit: Payer: Self-pay | Admitting: Physician Assistant

## 2015-01-15 ENCOUNTER — Ambulatory Visit: Payer: Self-pay | Admitting: Physician Assistant

## 2015-01-15 ENCOUNTER — Encounter: Payer: Self-pay | Admitting: Physician Assistant

## 2015-01-15 VITALS — BP 110/66 | HR 97 | Temp 98.6°F | Ht 63.0 in | Wt 141.6 lb

## 2015-01-15 DIAGNOSIS — F17218 Nicotine dependence, cigarettes, with other nicotine-induced disorders: Secondary | ICD-10-CM | POA: Insufficient documentation

## 2015-01-15 DIAGNOSIS — Z131 Encounter for screening for diabetes mellitus: Secondary | ICD-10-CM

## 2015-01-15 DIAGNOSIS — Z13220 Encounter for screening for lipoid disorders: Secondary | ICD-10-CM

## 2015-01-15 DIAGNOSIS — J449 Chronic obstructive pulmonary disease, unspecified: Secondary | ICD-10-CM | POA: Insufficient documentation

## 2015-01-15 DIAGNOSIS — F319 Bipolar disorder, unspecified: Secondary | ICD-10-CM | POA: Insufficient documentation

## 2015-01-15 DIAGNOSIS — I10 Essential (primary) hypertension: Secondary | ICD-10-CM | POA: Insufficient documentation

## 2015-01-15 MED ORDER — ALBUTEROL SULFATE HFA 108 (90 BASE) MCG/ACT IN AERS: 2.0000 | INHALATION_SPRAY | Freq: Four times a day (QID) | RESPIRATORY_TRACT | Status: DC | PRN

## 2015-01-15 MED ORDER — ALBUTEROL SULFATE (2.5 MG/3ML) 0.083% IN NEBU: 2.5000 mg | INHALATION_SOLUTION | Freq: Four times a day (QID) | RESPIRATORY_TRACT | Status: AC | PRN

## 2015-01-15 NOTE — Progress Notes (Signed)
BP 110/66 mmHg  Pulse 97  Temp(Src) 98.6 F (37 C)  Ht 5\' 3"  (1.6 m)  Wt 141 lb 9.6 oz (64.229 kg)  BMI 25.09 kg/m2  SpO2 99%  LMP 12/30/2014   Subjective:    Patient ID: Rita Baldwin, female    DOB: May 14, 1985, 28 y.o.   MRN: [id number]  HPI: Rita Baldwin is a 29 y.o. female presenting on 01/15/2015 for New Patient (Initial Visit)   HPI -Pt d/c from [facility] on 10/26 - was admitted on 10/22. Had hypoxia from COPD exacerbation -pt has appt with gyn nov 10 -pt was previously treated at [facility]  Relevant past medical, surgical, family and social history reviewed and updated as indicated. Interim medical history since our last visit reviewed. Allergies and medications reviewed and updated.   Current outpatient prescriptions:  .  albuterol (VENTOLIN HFA) 108 (90 BASE) MCG/ACT inhaler, Inhale 2 puffs into the lungs every 4 (four) hours as needed for wheezing or shortness of breath., Disp: , Rfl:  .  atenolol (TENORMIN) 25 MG tablet, Take 25 mg by mouth daily., Disp: , Rfl:  .  chlordiazePOXIDE (LIBRIUM) 10 MG capsule, Take 25 mg by mouth 3 (three) times daily. , Disp: , Rfl:  .  cloNIDine (CATAPRES) 0.1 MG tablet, Take 0.1 mg by mouth at bedtime. , Disp: , Rfl:  .  gabapentin (NEURONTIN) 400 MG capsule, Take 400-800 mg by mouth 2 (two) times daily. 1 in morning and 2 at bedtime, Disp: , Rfl:  .  HYDROcodone-acetaminophen (NORCO/VICODIN) 5-325 MG per tablet, Take 1 tablet by mouth every 4 (four) hours as needed., Disp: 10 tablet, Rfl: 0 .  ibuprofen (ADVIL,MOTRIN) 800 MG tablet, Take 1 tablet (800 mg total) by mouth 3 (three) times daily. (Patient taking differently: Take 800 mg by mouth as needed. ), Disp: 21 tablet, Rfl: 0 .  predniSONE (DELTASONE) 20 MG tablet, Take 20 mg by mouth 2 (two) times daily with a meal. 1/2 tab bid, Disp: , Rfl:  .  promethazine (PHENERGAN) 25 MG tablet, Take 25 mg by mouth daily as needed for nausea or vomiting., Disp: , Rfl:  .  ranitidine (ZANTAC)  300 MG tablet, Take 300 mg by mouth daily., Disp: , Rfl:    Review of Systems  Constitutional: Positive for diaphoresis and appetite change. Negative for fever, chills, fatigue and unexpected weight change.  HENT: Positive for congestion, dental problem, ear pain, sneezing and sore throat. Negative for drooling, facial swelling, hearing loss, mouth sores, trouble swallowing and voice change.   Eyes: Negative for pain, discharge, redness, itching and visual disturbance.  Respiratory: Positive for shortness of breath and wheezing. Negative for cough and choking.   Cardiovascular: Positive for chest pain. Negative for palpitations and leg swelling.  Gastrointestinal: Negative for vomiting, abdominal pain, diarrhea, constipation and blood in stool.  Endocrine: Negative for cold intolerance, heat intolerance and polydipsia.  Genitourinary: Negative for dysuria, hematuria and decreased urine volume.  Musculoskeletal: Positive for back pain and arthralgias. Negative for gait problem.  Skin: Negative for rash.  Allergic/Immunologic: Positive for environmental allergies.  Neurological: Positive for light-headedness and headaches. Negative for seizures and syncope.  Hematological: Negative for adenopathy.  Psychiatric/Behavioral: Positive for agitation. Negative for suicidal ideas and dysphoric mood. The patient is nervous/anxious.     Per HPI unless specifically indicated above     Objective:    BP 110/66 mmHg  Pulse 97  Temp(Src) 98.6 F (37 C)  Ht 5\' 3"  (1.6 m)  Wt 141 lb 9.6 oz (64.229 kg)  BMI 25.09 kg/m2  SpO2 99%  LMP 12/30/2014  Wt Readings from Last 3 Encounters:  01/15/15 141 lb 9.6 oz (64.229 kg)  01/02/15 135 lb (61.236 kg)  12/19/14 150 lb (68.04 kg)    Physical Exam  Constitutional: She is oriented to person, place, and time. She appears well-developed and well-nourished.  HENT:  Head: Normocephalic and atraumatic.  Mouth/Throat: Oropharynx is clear and moist. No  oropharyngeal exudate.  Eyes: Conjunctivae and EOM are normal. Pupils are equal, round, and reactive to light.  Eyes prominent and very large  Neck: Neck supple. No thyromegaly present.  Cardiovascular: Normal rate and regular rhythm.   Pulmonary/Chest: Effort normal and breath sounds normal.  Abdominal: Soft. Bowel sounds are normal. She exhibits no mass. There is no tenderness.  Musculoskeletal: She exhibits no edema.  Lymphadenopathy:    She has no cervical adenopathy.  Neurological: She is alert and oriented to person, place, and time. Gait normal.  Skin: Skin is warm and dry.  Psychiatric: Her behavior is normal. Thought content normal. Her mood appears anxious. Her affect is not inappropriate. Her speech is rapid and/or pressured.  Vitals reviewed.       Assessment & Plan:   Encounter Diagnoses  Name Primary?  . Chronic obstructive pulmonary disease, unspecified COPD type ([place]) Yes  . Essential hypertension, benign   . Bipolar disorder, unspecified ([place])   . Screening cholesterol level   . Screening for diabetes mellitus   . Nicotine dependence, cigarettes, with other nicotine-induced disorders      -Start on dulera. Sign up for medassist- order dulera and albuterol.  Gave sample dulera -counseled on smoking cessation -may take plain mucinex for the mucus. Increase water. -Get baseline labs.  -Gave rx ventolin and alb for neb -F/u 3 wk to recheck breathing

## 2015-01-16 IMAGING — CT CT MAXILLOFACIAL W/O CM
3 of 4 series · 16 of 47 positions shown, 19 images · non-contrast
Comparison: Brain CT May 12, 2009

CLINICAL DATA: Pain post trauma

EXAM:
CT MAXILLOFACIAL WITHOUT CONTRAST
TECHNIQUE: Multidetector CT imaging of the maxillofacial structures was
performed. Multiplanar CT image reconstructions were also generated.
A small metallic BB was placed on the right temple in order to
reliably differentiate right from left.

[Series 2: max soft 2.0 h31s · axial · 0.37mm/px · z∈[+53,+192]mm · 10 of 109 slices shown, 13 images]
[im 8/109  brain]
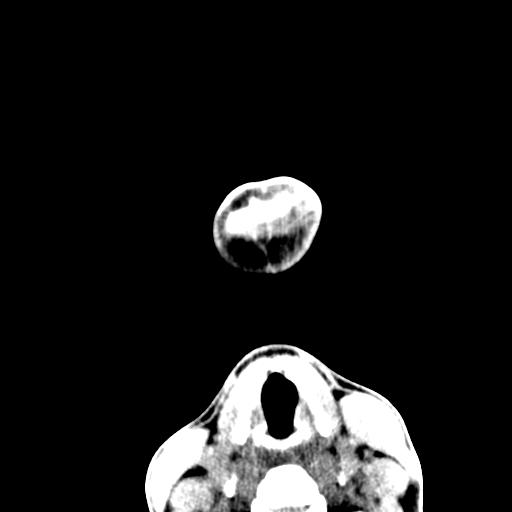
[im 8/109  bone]
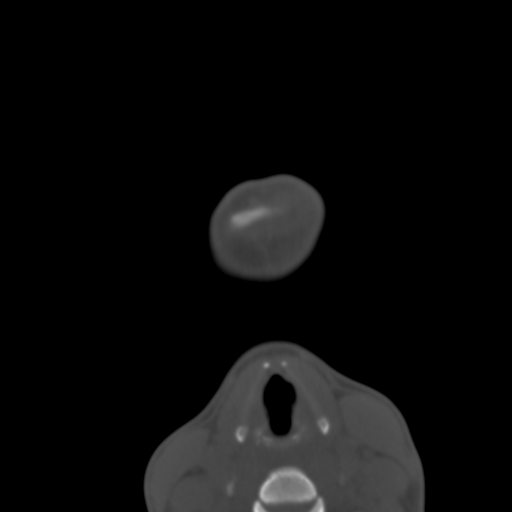
[im 19/109  bone]
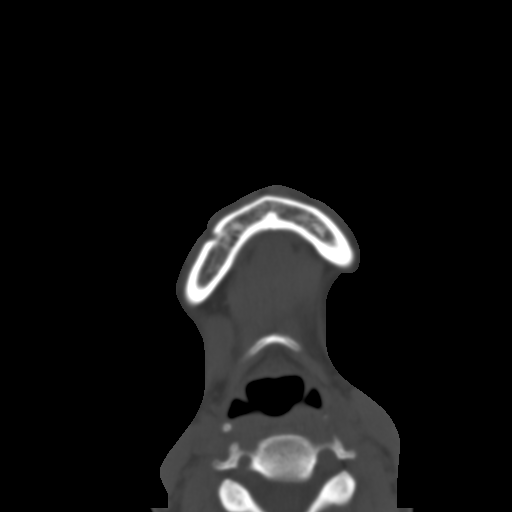
[im 30/109  bone]
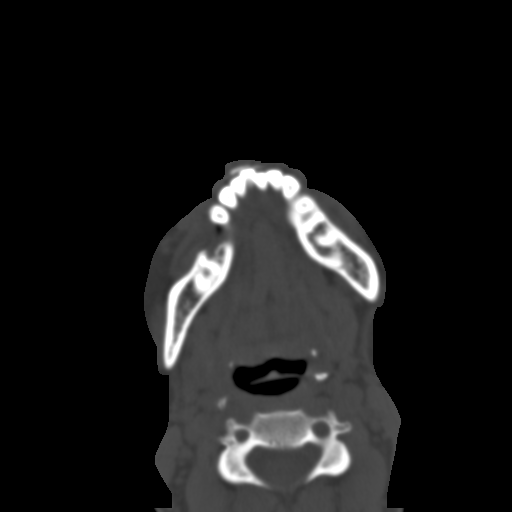
[im 38/109  bone]
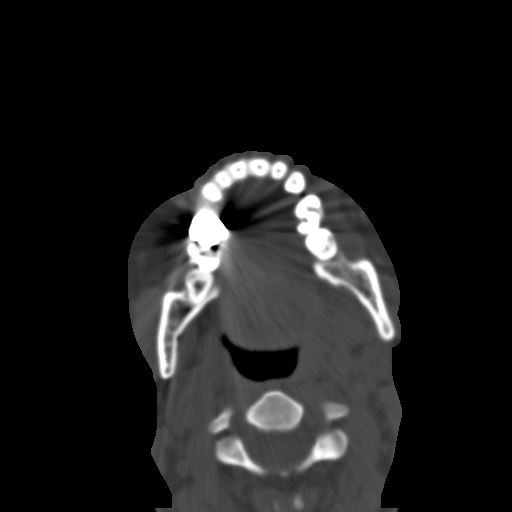
[im 49/109  brain]
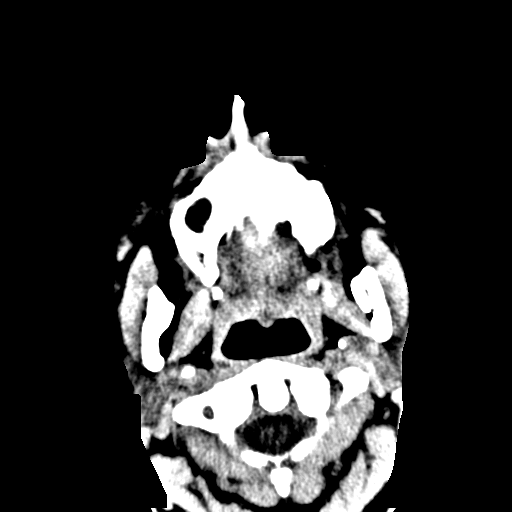
[im 49/109  bone]
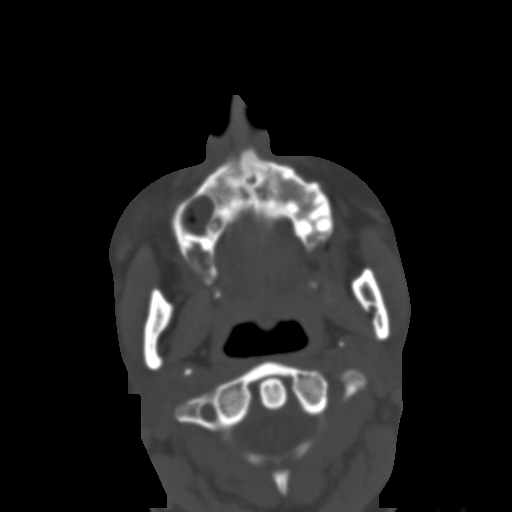
[im 60/109  bone]
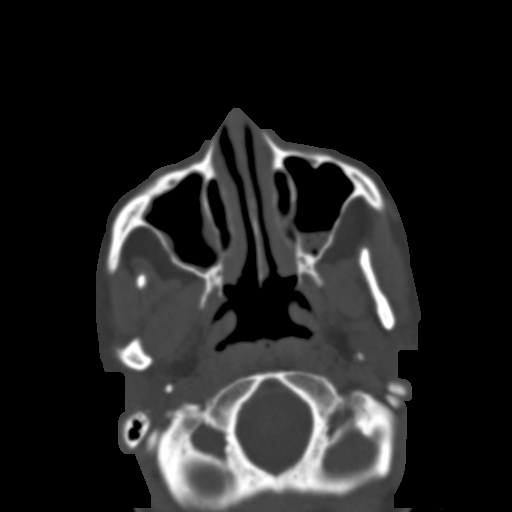
[im 71/109  bone]
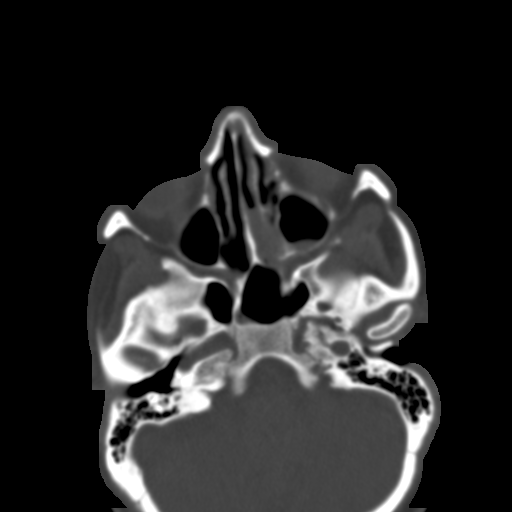
[im 82/109  bone]
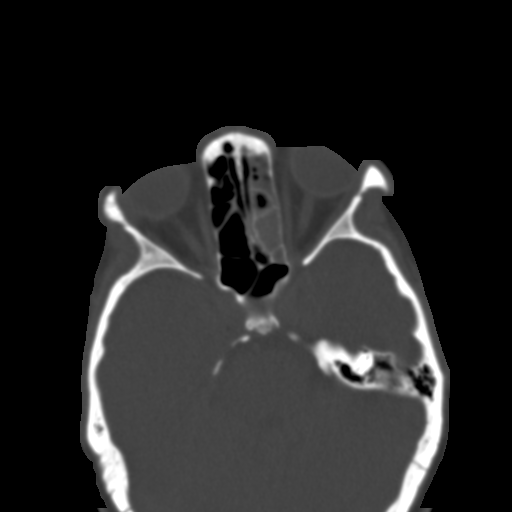
[im 90/109  brain]
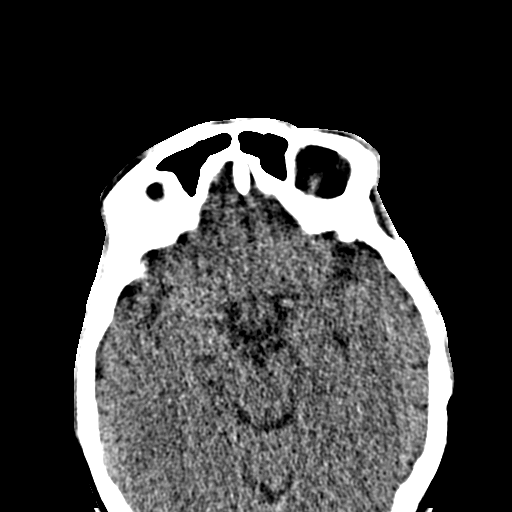
[im 90/109  bone]
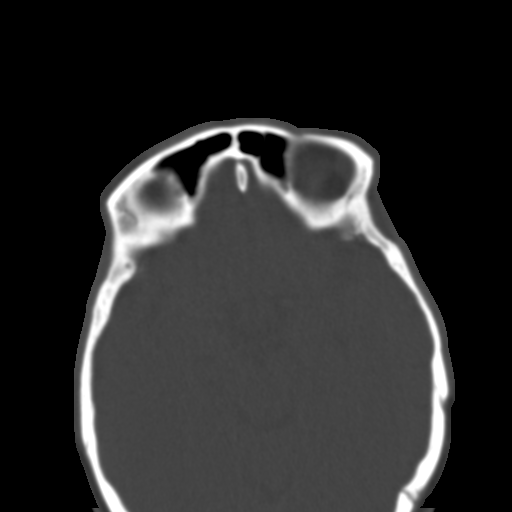
[im 101/109  bone]
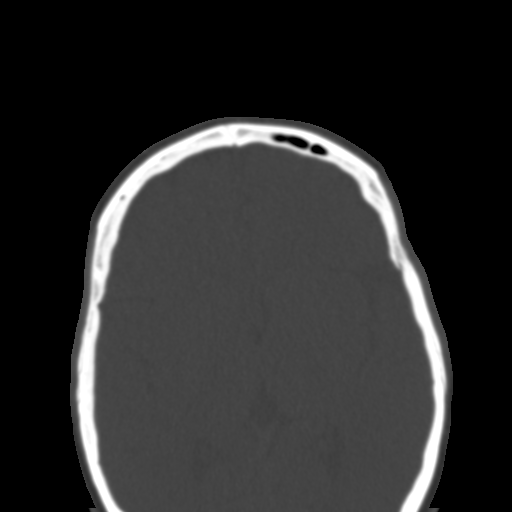

[Series 5: max st sagittal · sagittal · 0.34mm/px · 3 of 76 slices shown]
[im 26/76  bone]
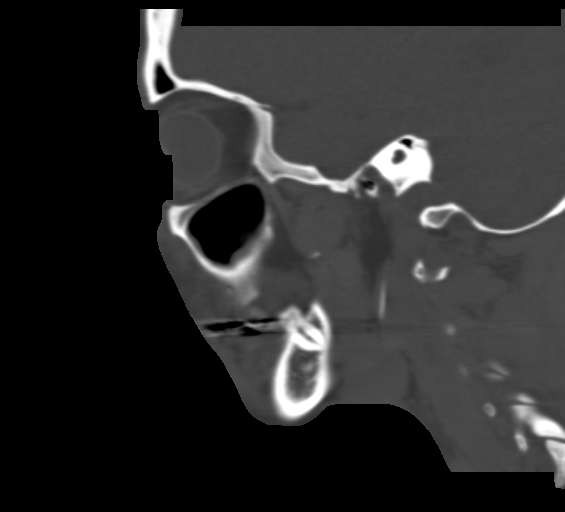
[im 38/76  bone]
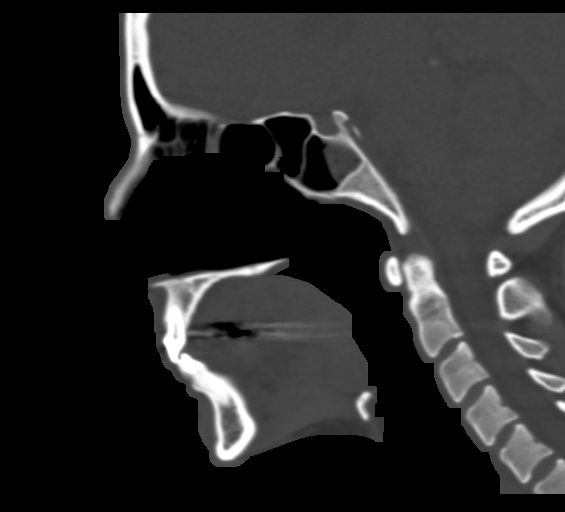
[im 51/76  bone]
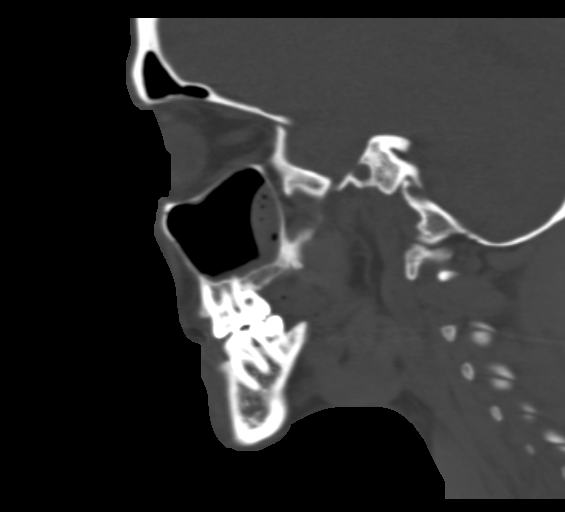

[Series 6: max bone coronal 2.0 spo · coronal · 0.33mm/px · 3 of 110 slices shown]
[im 22/110  bone]
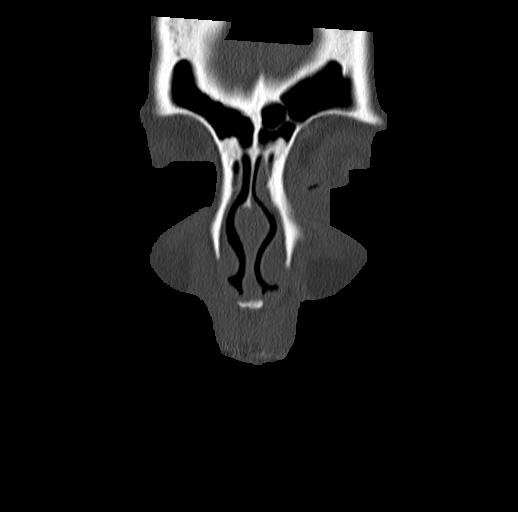
[im 44/110  bone]
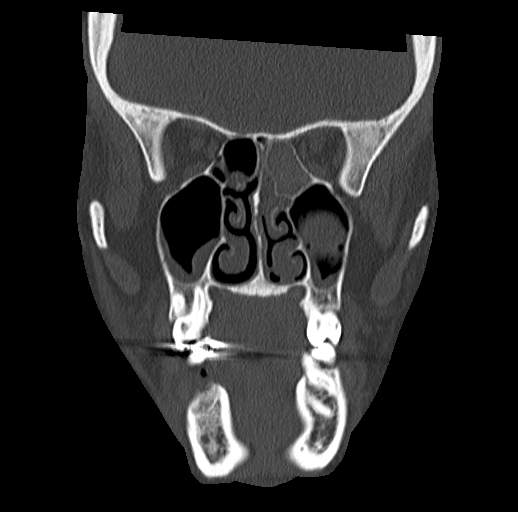
[im 66/110  bone]
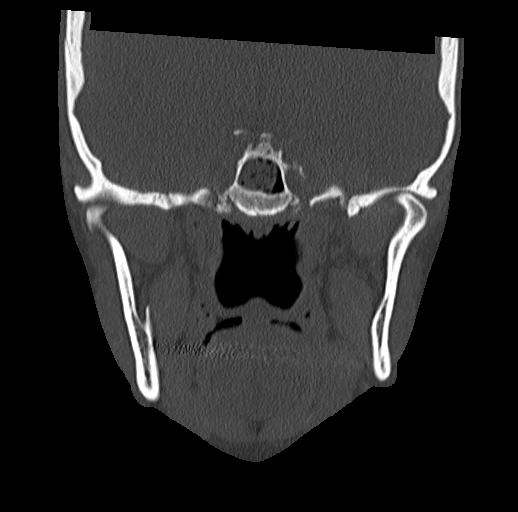

[16 of 47 positions shown; findings below may reference images not displayed]

FINDINGS: There is a small avulsion off the anterior left nasal bone. There is
no other evidence of fracture. There is no appreciable dislocation.
There are no intraorbital lesions.

There is extensive opacification in the left ethmoid sinus complex.
There is patchy sinusitis in the right ethmoid sinus complex. There
is mucosal thickening in the posterior left sphenoid sinus. There is
also mucosal thickening in both maxillary antra, more on the left
than on the right.

There is obstruction of both ostiomeatal unit complexes. There is
rightward deviation of the upper nasal septum. There is mild nasal
turbinate edema on the left with mild nares obstruction inferiorly
on the left.

The visualized brain parenchyma appears within normal limits.
IMPRESSION: Small avulsion arising from the anterior left nasal bone. No other
fracture. Extensive paranasal sinus disease. Obstruction both
ostiomeatal unit complexes. Paranasal sinus disease overall is
significantly more pronounced on the left than on the right. There
may be some intermingled hemorrhage in these areas. There is mild
rightward deviation of the nasal septum.

## 2015-01-17 LAB — TSH: TSH: 1.262 u[IU]/mL (ref 0.350–4.500)

## 2015-01-17 LAB — BASIC METABOLIC PANEL
BUN: 16 mg/dL (ref 7–25)
CHLORIDE: 103 mmol/L (ref 98–110)
CO2: 23 mmol/L (ref 20–31)
Calcium: 9.1 mg/dL (ref 8.6–10.2)
Creat: 0.57 mg/dL (ref 0.50–1.10)
GLUCOSE: 80 mg/dL (ref 65–99)
POTASSIUM: 4.2 mmol/L (ref 3.5–5.3)
SODIUM: 136 mmol/L (ref 135–146)

## 2015-01-17 LAB — LIPID PANEL
CHOLESTEROL: 231 mg/dL — AB (ref 125–200)
HDL: 62 mg/dL (ref >=46)
LDL Cholesterol: 150 mg/dL — ABNORMAL HIGH (ref <130)
TRIGLYCERIDES: 96 mg/dL (ref <150)
Total CHOL/HDL Ratio: 3.7 Ratio (ref <=5.0)
VLDL: 19 mg/dL (ref <30)

## 2015-01-23 ENCOUNTER — Other Ambulatory Visit: Payer: Self-pay | Admitting: Physician Assistant

## 2015-01-23 MED ORDER — MOMETASONE FURO-FORMOTEROL FUM 100-5 MCG/ACT IN AERO: 2.0000 | INHALATION_SPRAY | Freq: Two times a day (BID) | RESPIRATORY_TRACT | Status: DC

## 2015-01-23 MED ORDER — ALBUTEROL SULFATE HFA 108 (90 BASE) MCG/ACT IN AERS: 2.0000 | INHALATION_SPRAY | Freq: Four times a day (QID) | RESPIRATORY_TRACT | Status: AC | PRN

## 2015-02-05 ENCOUNTER — Ambulatory Visit: Payer: Self-pay | Admitting: Physician Assistant

## 2015-02-06 ENCOUNTER — Encounter: Payer: Self-pay | Admitting: Physician Assistant

## 2015-02-06 ENCOUNTER — Ambulatory Visit: Payer: Self-pay | Admitting: Physician Assistant

## 2015-02-06 VITALS — BP 102/64 | HR 105 | Temp 98.1°F | Ht 63.0 in | Wt 141.6 lb

## 2015-02-06 DIAGNOSIS — F17218 Nicotine dependence, cigarettes, with other nicotine-induced disorders: Secondary | ICD-10-CM

## 2015-02-06 DIAGNOSIS — J449 Chronic obstructive pulmonary disease, unspecified: Secondary | ICD-10-CM

## 2015-02-06 DIAGNOSIS — I10 Essential (primary) hypertension: Secondary | ICD-10-CM

## 2015-02-06 DIAGNOSIS — E785 Hyperlipidemia, unspecified: Secondary | ICD-10-CM

## 2015-02-06 MED ORDER — ATORVASTATIN CALCIUM 10 MG PO TABS: 10.0000 mg | ORAL_TABLET | Freq: Every day | ORAL | Status: DC

## 2015-02-06 MED ORDER — IBUPROFEN 800 MG PO TABS: 800.0000 mg | ORAL_TABLET | Freq: Three times a day (TID) | ORAL | Status: DC | PRN

## 2015-02-06 NOTE — Patient Instructions (Signed)

## 2015-02-06 NOTE — Progress Notes (Signed)
BP 102/64 mmHg  Pulse 105  Temp(Src) 98.1 F (36.7 C)  Ht 5\' 3"  (1.6 m)  Wt 141 lb 9.6 oz (64.229 kg)  BMI 25.09 kg/m2  SpO2 99%   Subjective:    Patient ID: Rita Baldwin, female    DOB: 12-08-1985, 29 y.o.   MRN: [id number]  HPI: Rita Baldwin is a 29 y.o. female presenting on 02/06/2015 for COPD   HPI  Chief Complaint  Patient presents with  . COPD    has some wheezing    -Pt going to [facility] (in [place]) for [facility] -She is still smoking but is working on stopping. She is using the nicotine patch.  -Pt c/o nausea from the acyclovir. Recommended to her that she discuss with the prescriber  Relevant past medical, surgical, family and social history reviewed and updated as indicated. Interim medical history since our last visit reviewed. Allergies and medications reviewed and updated.  Current outpatient prescriptions:  .  acyclovir (ZOVIRAX) 400 MG tablet, Take 400 mg by mouth 2 (two) times daily., Disp: , Rfl:  .  albuterol (PROVENTIL HFA;VENTOLIN HFA) 108 (90 BASE) MCG/ACT inhaler, Inhale 2 puffs into the lungs every 6 (six) hours as needed for wheezing or shortness of breath., Disp: 3 Inhaler, Rfl: 1 .  albuterol (PROVENTIL) (2.5 MG/3ML) 0.083% nebulizer solution, Take 3 mLs (2.5 mg total) by nebulization every 6 (six) hours as needed for wheezing or shortness of breath., Disp: 25 vial, Rfl: 3 .  atenolol (TENORMIN) 25 MG tablet, Take 25 mg by mouth daily., Disp: , Rfl:  .  chlordiazePOXIDE (LIBRIUM) 10 MG capsule, Take 25 mg by mouth 3 (three) times daily. , Disp: , Rfl:  .  cloNIDine (CATAPRES) 0.1 MG tablet, Take 0.1 mg by mouth at bedtime. , Disp: , Rfl:  .  gabapentin (NEURONTIN) 400 MG capsule, Take 400-800 mg by mouth 2 (two) times daily. 1 in morning and 2 at bedtime, Disp: , Rfl:  .  HYDROcodone-acetaminophen (NORCO/VICODIN) 5-325 MG per tablet, Take 1 tablet by mouth every 4 (four) hours as needed., Disp: 10 tablet, Rfl: 0 .  ibuprofen (ADVIL,MOTRIN) 800  MG tablet, Take 1 tablet (800 mg total) by mouth 3 (three) times daily. (Patient taking differently: Take 800 mg by mouth as needed. ), Disp: 21 tablet, Rfl: 0 .  levonorgestrel-ethinyl estradiol (SEASONALE,INTROVALE,JOLESSA) 0.15-0.03 MG tablet, Take 1 tablet by mouth daily., Disp: , Rfl:  .  mometasone-formoterol (DULERA) 100-5 MCG/ACT AERO, Inhale 2 puffs into the lungs 2 (two) times daily., Disp: 3 Inhaler, Rfl: 0 .  promethazine (PHENERGAN) 25 MG tablet, Take 25 mg by mouth daily as needed for nausea or vomiting., Disp: , Rfl:  .  ranitidine (ZANTAC) 300 MG tablet, Take 300 mg by mouth daily., Disp: , Rfl:   Review of Systems  Constitutional: Positive for appetite change and fatigue. Negative for fever, chills, diaphoresis and unexpected weight change.  HENT: Positive for congestion, dental problem and voice change. Negative for drooling, ear pain, facial swelling, hearing loss, mouth sores, sneezing, sore throat and trouble swallowing.   Eyes: Negative for pain, discharge, redness, itching and visual disturbance.  Respiratory: Positive for shortness of breath and wheezing. Negative for cough and choking.   Cardiovascular: Negative for chest pain, palpitations and leg swelling.  Gastrointestinal: Negative for vomiting, abdominal pain, diarrhea, constipation and blood in stool.  Endocrine: Positive for polydipsia. Negative for cold intolerance and heat intolerance.  Genitourinary: Negative for dysuria, hematuria and decreased urine volume.  Musculoskeletal: Positive for back pain and arthralgias. Negative for gait problem.  Skin: Negative for rash.  Allergic/Immunologic: Positive for environmental allergies.  Neurological: Positive for headaches. Negative for seizures, syncope and light-headedness.  Hematological: Negative for adenopathy.  Psychiatric/Behavioral: Positive for dysphoric mood. Negative for suicidal ideas and agitation. The patient is nervous/anxious.     Per HPI unless  specifically indicated above     Objective:    BP 102/64 mmHg  Pulse 105  Temp(Src) 98.1 F (36.7 C)  Ht 5\' 3"  (1.6 m)  Wt 141 lb 9.6 oz (64.229 kg)  BMI 25.09 kg/m2  SpO2 99%  Wt Readings from Last 3 Encounters:  02/06/15 141 lb 9.6 oz (64.229 kg)  01/15/15 141 lb 9.6 oz (64.229 kg)  01/02/15 135 lb (61.236 kg)    Physical Exam  Constitutional: She is oriented to person, place, and time. She appears well-developed and well-nourished.  HENT:  Head: Normocephalic and atraumatic.  Neck: Neck supple.  Cardiovascular: Normal rate and regular rhythm.   Pulmonary/Chest: Effort normal and breath sounds normal. She has no wheezes.  Abdominal: Soft. Bowel sounds are normal. She exhibits no mass. There is no tenderness.  Musculoskeletal: She exhibits no edema.  Lymphadenopathy:    She has no cervical adenopathy.  Neurological: She is alert and oriented to person, place, and time.  Skin: Skin is warm and dry.  Psychiatric: Her behavior is normal. Her mood appears anxious. Her speech is rapid and/or pressured.  Vitals reviewed.   Results for orders placed or performed in visit on 01/15/15  Lipid Profile  Result Value Ref Range   Cholesterol 231 (H) 125 - 200 mg/dL   Triglycerides 96 <150 mg/dL   HDL 62 >=46 mg/dL   Total CHOL/HDL Ratio 3.7 <=5.0 Ratio   VLDL 19 <30 mg/dL   LDL Cholesterol 150 (H) <130 mg/dL  TSH  Result Value Ref Range   TSH 1.262 0.350 - 4.500 uIU/mL  Basic Metabolic Panel (BMET)  Result Value Ref Range   Sodium 136 135 - 146 mmol/L   Potassium 4.2 3.5 - 5.3 mmol/L   Chloride 103 98 - 110 mmol/L   CO2 23 20 - 31 mmol/L   Glucose, Bld 80 65 - 99 mg/dL   BUN 16 7 - 25 mg/dL   Creat 0.57 0.50 - 1.10 mg/dL   Calcium 9.1 8.6 - 10.2 mg/dL      Assessment & Plan:   Encounter Diagnoses  Name Primary?  . Chronic obstructive pulmonary disease, unspecified COPD type ([place]) Yes  . Nicotine dependence, cigarettes, with other nicotine-induced disorders   .  Hyperlipemia   . Essential hypertension, benign      -reviewed labs with pt  -Pt is to get no more samples- she has been told repeatedly she need to bring in more information for her to get approved for medassist and she has not done this yet continues to ask for more samples -counseled on smoking cessation -discussed medicaiton for lipids but pt wants to work on lowfat diet and exercise for 3 months to see if she can get it lowered -F/u 3 mo- chol. rto sooner prn

## 2015-03-14 ENCOUNTER — Encounter (HOSPITAL_COMMUNITY): Payer: Self-pay | Admitting: Emergency Medicine

## 2015-03-14 ENCOUNTER — Emergency Department (HOSPITAL_COMMUNITY)
Admission: EM | Admit: 2015-03-14 | Discharge: 2015-03-14 | Disposition: A | Discharge status: Home Health | Payer: Self-pay | Attending: Emergency Medicine | Admitting: Emergency Medicine

## 2015-03-14 DIAGNOSIS — Y9389 Activity, other specified: Secondary | ICD-10-CM | POA: Insufficient documentation

## 2015-03-14 DIAGNOSIS — I10 Essential (primary) hypertension: Secondary | ICD-10-CM | POA: Insufficient documentation

## 2015-03-14 DIAGNOSIS — F419 Anxiety disorder, unspecified: Secondary | ICD-10-CM | POA: Insufficient documentation

## 2015-03-14 DIAGNOSIS — Y9289 Other specified places as the place of occurrence of the external cause: Secondary | ICD-10-CM | POA: Insufficient documentation

## 2015-03-14 DIAGNOSIS — S0502XA Injury of conjunctiva and corneal abrasion without foreign body, left eye, initial encounter: Secondary | ICD-10-CM | POA: Insufficient documentation

## 2015-03-14 DIAGNOSIS — Z87442 Personal history of urinary calculi: Secondary | ICD-10-CM | POA: Insufficient documentation

## 2015-03-14 DIAGNOSIS — J449 Chronic obstructive pulmonary disease, unspecified: Secondary | ICD-10-CM | POA: Insufficient documentation

## 2015-03-14 DIAGNOSIS — Z8742 Personal history of other diseases of the female genital tract: Secondary | ICD-10-CM | POA: Insufficient documentation

## 2015-03-14 DIAGNOSIS — K219 Gastro-esophageal reflux disease without esophagitis: Secondary | ICD-10-CM | POA: Insufficient documentation

## 2015-03-14 DIAGNOSIS — Z8739 Personal history of other diseases of the musculoskeletal system and connective tissue: Secondary | ICD-10-CM | POA: Insufficient documentation

## 2015-03-14 DIAGNOSIS — Y998 Other external cause status: Secondary | ICD-10-CM | POA: Insufficient documentation

## 2015-03-14 DIAGNOSIS — Z8619 Personal history of other infectious and parasitic diseases: Secondary | ICD-10-CM | POA: Insufficient documentation

## 2015-03-14 DIAGNOSIS — X58XXXA Exposure to other specified factors, initial encounter: Secondary | ICD-10-CM | POA: Insufficient documentation

## 2015-03-14 DIAGNOSIS — F17210 Nicotine dependence, cigarettes, uncomplicated: Secondary | ICD-10-CM | POA: Insufficient documentation

## 2015-03-14 MED ORDER — KETOROLAC TROMETHAMINE (PF) 0.45 % OP SOLN
1.0000 [drp] | Freq: Four times a day (QID) | OPHTHALMIC | Status: DC | PRN
Start: 2015-03-14 — End: 2015-05-09

## 2015-03-14 MED ORDER — TETRACAINE HCL 0.5 % OP SOLN
1.0000 [drp] | Freq: Once | OPHTHALMIC | Status: AC
  Administered 2015-03-14: 1 [drp] via OPHTHALMIC

## 2015-03-14 MED ORDER — LIDOCAINE-EPINEPHRINE-TETRACAINE (LET) SOLUTION: 3.0000 mL | Freq: Once | NASAL | Status: DC

## 2015-03-14 MED ORDER — ERYTHROMYCIN 5 MG/GM OP OINT: TOPICAL_OINTMENT | OPHTHALMIC | Status: DC

## 2015-03-14 MED ORDER — ERYTHROMYCIN 5 MG/GM OP OINT
TOPICAL_OINTMENT | Freq: Once | OPHTHALMIC | Status: AC
  Administered 2015-03-14: 19:00:00 via OPHTHALMIC
  Filled 2015-03-14: qty 3.5

## 2015-03-14 MED ORDER — TETRACAINE HCL 0.5 % OP SOLN
OPHTHALMIC | Status: AC
Start: 2015-03-14 — End: 2015-03-14
  Administered 2015-03-14: 1 [drp] via OPHTHALMIC
  Filled 2015-03-14: qty 4

## 2015-03-14 MED ORDER — FLUORESCEIN SODIUM 1 MG OP STRP
1.0000 | ORAL_STRIP | Freq: Once | OPHTHALMIC | Status: AC
  Administered 2015-03-14: 1 via OPHTHALMIC

## 2015-03-14 MED ORDER — FLUORESCEIN SODIUM 1 MG OP STRP
ORAL_STRIP | OPHTHALMIC | Status: AC
Start: 2015-03-14 — End: 2015-03-14
  Administered 2015-03-14: 1 via OPHTHALMIC
  Filled 2015-03-14: qty 1

## 2015-03-14 NOTE — ED Notes (Signed)
Pt will not open either eye  to have visual acuity checked.

## 2015-03-14 NOTE — ED Notes (Addendum)
Patient states "I rubbed my left eye this morning and it started hurting really bad. I had some numbing drops from before that I used earlier today but it's hurting worse." Redness noted to left eye. Patient states "I can open my eye but it hurts really bad when I blink."

## 2015-03-14 NOTE — Discharge Instructions (Signed)
Your eye exam reveals a corneal abrasion. Please apply the antibiotics prescribed and put topical pain meds as needed. See the Eye Doctor in 2 days as requested - her information has been provided to call and make an appointment.   Corneal Abrasion The cornea is the clear covering at the front and center of the eye. When looking at the colored portion of the eye (iris), you are looking through the cornea. This very thin tissue is made up of many layers. The surface layer is a single layer of cells (corneal epithelium) and is one of the most sensitive tissues in the body. If a scratch or injury causes the corneal epithelium to come off, it is called a corneal abrasion. If the injury extends to the tissues below the epithelium, the condition is called a corneal ulcer. CAUSES   Scratches.  Trauma.  Foreign body in the eye. Some people have recurrences of abrasions in the area of the original injury even after it has healed (recurrent erosion syndrome). Recurrent erosion syndrome generally improves and goes away with time. SYMPTOMS   Eye pain.  Difficulty or inability to keep the injured eye open.  The eye becomes very sensitive to light.  Recurrent erosions tend to happen suddenly, first thing in the morning, usually after waking up and opening the eye. DIAGNOSIS  Your health care provider can diagnose a corneal abrasion during an eye exam. Dye is usually placed in the eye using a drop or a small paper strip moistened by your tears. When the eye is examined with a special light, the abrasion shows up clearly because of the dye. TREATMENT   Small abrasions may be treated with antibiotic drops or ointment alone.  A pressure patch may be put over the eye. If this is done, follow your doctor's instructions for when to remove the patch. Do not drive or use machines while the eye patch is on. Judging distances is hard to do with a patch on. If the abrasion becomes infected and spreads to the  deeper tissues of the cornea, a corneal ulcer can result. This is serious because it can cause corneal scarring. Corneal scars interfere with light passing through the cornea and cause a loss of vision in the involved eye. HOME CARE INSTRUCTIONS  Use medicine or ointment as directed. Only take over-the-counter or prescription medicines for pain, discomfort, or fever as directed by your health care provider.  Do not drive or operate machinery if your eye is patched. Your ability to judge distances is impaired.  If your health care provider has given you a follow-up appointment, it is very important to keep that appointment. Not keeping the appointment could result in a severe eye infection or permanent loss of vision. If there is any problem keeping the appointment, let your health care provider know. SEEK MEDICAL CARE IF:   You have pain, light sensitivity, and a scratchy feeling in one eye or both eyes.  Your pressure patch keeps loosening up, and you can blink your eye under the patch after treatment.  Any kind of discharge develops from the eye after treatment or if the lids stick together in the morning.  You have the same symptoms in the morning as you did with the original abrasion days, weeks, or months after the abrasion healed.   This information is not intended to replace advice given to you by your health care provider. Make sure you discuss any questions you have with your health care provider.  Document Released: 02/29/2000 Document Revised: 11/22/2014 Document Reviewed: 11/08/2012 °Elsevier Interactive Patient Education ©2016 [employer] ° °

## 2015-03-15 ENCOUNTER — Ambulatory Visit: Payer: Self-pay | Admitting: Physician Assistant

## 2015-03-15 ENCOUNTER — Encounter: Payer: Self-pay | Admitting: Physician Assistant

## 2015-03-15 VITALS — BP 104/58 | HR 85 | Temp 99.1°F | Ht 63.0 in | Wt 134.7 lb

## 2015-03-15 DIAGNOSIS — J029 Acute pharyngitis, unspecified: Secondary | ICD-10-CM

## 2015-03-15 NOTE — Progress Notes (Signed)
BP 104/58 mmHg  Pulse 85  Temp(Src) 99.1 F (37.3 C)  Ht 5\' 3"  (1.6 m)  Wt 134 lb 11.2 oz (61.1 kg)  BMI 23.87 kg/m2  SpO2 98%  LMP 02/23/2015   Subjective:    Patient ID: Rita Baldwin, female    DOB: 12-06-1985, 28 y.o.   MRN: [id number]  HPI: Rita Baldwin is a 29 y.o. female presenting on 03/15/2015 for Sore Throat   Sore Throat  This is a new problem. The current episode started yesterday. The problem has been rapidly improving. The pain is worse on the right side. There has been no fever. Associated symptoms include congestion, headaches and shortness of breath. Pertinent negatives include no abdominal pain, coughing, diarrhea, drooling, ear pain, trouble swallowing or vomiting. She has had no exposure to mono. Treatments tried: hot tea. The treatment provided moderate relief.   Chief Complaint  Patient presents with  . Sore Throat    pt states it is a lot better today than it was yesterday    Relevant past medical, surgical, family and social history reviewed and updated as indicated. Interim medical history since our last visit reviewed. Allergies and medications reviewed and updated.  Current outpatient prescriptions:  .  acyclovir (ZOVIRAX) 400 MG tablet, Take 400 mg by mouth 2 (two) times daily., Disp: , Rfl:  .  albuterol (PROVENTIL HFA;VENTOLIN HFA) 108 (90 BASE) MCG/ACT inhaler, Inhale 2 puffs into the lungs every 6 (six) hours as needed for wheezing or shortness of breath., Disp: 3 Inhaler, Rfl: 1 .  albuterol (PROVENTIL) (2.5 MG/3ML) 0.083% nebulizer solution, Take 3 mLs (2.5 mg total) by nebulization every 6 (six) hours as needed for wheezing or shortness of breath., Disp: 25 vial, Rfl: 3 .  atenolol (TENORMIN) 25 MG tablet, Take 25 mg by mouth daily., Disp: , Rfl:  .  atorvastatin (LIPITOR) 10 MG tablet, Take 1 tablet (10 mg total) by mouth daily., Disp: 90 tablet, Rfl: 1 .  chlordiazePOXIDE (LIBRIUM) 10 MG capsule, Take 25 mg by mouth 2 (two) times  daily. , Disp: , Rfl:  .  cloNIDine (CATAPRES) 0.1 MG tablet, Take 0.1 mg by mouth at bedtime. , Disp: , Rfl:  .  erythromycin ophthalmic ointment, Place a 1/2 inch ribbon of ointment into the lower eyelid., Disp: 3.5 g, Rfl: 0 .  gabapentin (NEURONTIN) 400 MG capsule, Take 400-800 mg by mouth 2 (two) times daily. 1 in morning and 2 at bedtime, Disp: , Rfl:  .  ibuprofen (ADVIL,MOTRIN) 800 MG tablet, Take 1 tablet (800 mg total) by mouth every 8 (eight) hours as needed. (Patient taking differently: Take 800 mg by mouth every 8 (eight) hours as needed for mild pain. ), Disp: 30 tablet, Rfl: 0 .  Ketorolac Tromethamine 0.45 % SOLN, Apply 1 drop to eye 4 (four) times daily as needed., Disp: 1 each, Rfl: 0 .  levonorgestrel-ethinyl estradiol (SEASONALE,INTROVALE,JOLESSA) 0.15-0.03 MG tablet, Take 1 tablet by mouth daily., Disp: , Rfl:  .  mometasone-formoterol (DULERA) 100-5 MCG/ACT AERO, Inhale 2 puffs into the lungs 2 (two) times daily., Disp: 3 Inhaler, Rfl: 0 .  promethazine (PHENERGAN) 25 MG tablet, Take 25 mg by mouth daily as needed for nausea or vomiting., Disp: , Rfl:  .  ranitidine (ZANTAC) 300 MG tablet, Take 300 mg by mouth daily., Disp: , Rfl:    Review of Systems  Constitutional: Positive for diaphoresis, appetite change and fatigue. Negative for fever, chills and unexpected weight change.  HENT: Positive for  congestion, dental problem, sore throat and voice change. Negative for drooling, ear pain, facial swelling, hearing loss, mouth sores, sneezing and trouble swallowing.   Eyes: Positive for pain and redness. Negative for discharge, itching and visual disturbance.  Respiratory: Positive for shortness of breath. Negative for cough, choking and wheezing.   Cardiovascular: Negative for chest pain, palpitations and leg swelling.  Gastrointestinal: Negative for vomiting, abdominal pain, diarrhea, constipation and blood in stool.  Endocrine: Positive for cold intolerance. Negative for heat  intolerance and polydipsia.  Genitourinary: Negative for dysuria, hematuria and decreased urine volume.  Musculoskeletal: Positive for back pain and arthralgias. Negative for gait problem.  Skin: Negative for rash.  Allergic/Immunologic: Positive for environmental allergies.  Neurological: Positive for headaches. Negative for seizures, syncope and light-headedness.  Hematological: Negative for adenopathy.  Psychiatric/Behavioral: Positive for dysphoric mood and agitation. Negative for suicidal ideas. The patient is nervous/anxious.     Per HPI unless specifically indicated above     Objective:    BP 104/58 mmHg  Pulse 85  Temp(Src) 99.1 F (37.3 C)  Ht 5\' 3"  (1.6 m)  Wt 134 lb 11.2 oz (61.1 kg)  BMI 23.87 kg/m2  SpO2 98%  LMP 02/23/2015  Wt Readings from Last 3 Encounters:  03/15/15 134 lb 11.2 oz (61.1 kg)  03/14/15 145 lb (65.772 kg)  02/06/15 141 lb 9.6 oz (64.229 kg)    Physical Exam  Constitutional: She is oriented to person, place, and time. She appears well-developed and well-nourished.  HENT:  Head: Normocephalic and atraumatic.  Right Ear: Hearing, tympanic membrane, external ear and ear canal normal.  Left Ear: Hearing, tympanic membrane, external ear and ear canal normal.  Nose: Nose normal.  Mouth/Throat: Uvula is midline and oropharynx is clear and moist. No oropharyngeal exudate.  Neck: Neck supple.  Cardiovascular: Normal rate and regular rhythm.   Pulmonary/Chest: Effort normal and breath sounds normal. She has no wheezes.  Lymphadenopathy:    She has no cervical adenopathy.  Neurological: She is alert and oriented to person, place, and time.  Skin: Skin is warm and dry.  Psychiatric: She has a normal mood and affect. Her behavior is normal.  Vitals reviewed.       Assessment & Plan:    Encounter Diagnosis  Name Primary?  . Sore throat Yes   -counseled pt on care of URI and viral sore throat. -f/u as scheduled. RTO sooner prn

## 2015-03-15 NOTE — Patient Instructions (Signed)

## 2015-03-16 NOTE — ED Provider Notes (Signed)
CSN: [id number]     Arrival date & time 03/14/15  1712 History   First MD Initiated Contact with Patient 03/14/15 1801     Chief Complaint  Patient presents with  . Eye Pain     (Consider location/radiation/quality/duration/timing/severity/associated sxs/prior Treatment) HPI Comments: Pt comes in with cc of eye pain. Pt woke up in the evening after taking a nap, she cleared her eye and started having pain to the L eye immediately. She put some topical tetracaine she had left over at her home, got some relief, saw that the eye conjunctiva was red and went to work - but her pain intensified with light and so she came to the ER. She denies any loss of vision, trauma. Pain is sharp. She cant think of any foreign body exposure.   Patient is a 29 y.o. female presenting with eye pain. The history is provided by the patient.  Eye Pain Pertinent negatives include no headaches.    Past Medical History  Diagnosis Date  . Drug abuse   . UTI (lower urinary tract infection)   . Bronchitis   . Headache   . Overdose     "I overdosed on tylenol as a kid"  . Hypertension 2015  . Precancerous changes of the cervix as a child  . Anxiety 2012  . GERD (gastroesophageal reflux disease) age 76  . Chlamydia 2009  . Scoliosis fifth grade  . PTSD (post-traumatic stress disorder)   . COPD (chronic obstructive pulmonary disease) [facility])    Past Surgical History  Procedure Laterality Date  . Multiple tooth extractions     Family History  Problem Relation Age of Onset  . Cancer Mother     breast  . Hypertension Mother   . Asthma Father   . Diabetes Father   . Heart disease Father   . Hyperlipidemia Father   . Hypertension Father   . Cancer Maternal Aunt     melanoma  . Cirrhosis Paternal Uncle   . Diabetes Maternal Grandmother   . Diabetes Maternal Grandfather   . Diabetes Paternal Grandmother   . Diabetes Paternal Grandfather    Social History  Substance Use Topics  . Smoking status:  Current Every Day Smoker -- 0.50 packs/day for 9 years    Types: Cigarettes, E-cigarettes  . Smokeless tobacco: Never Used  . Alcohol Use: No     Comment: hx alcoholism. sober since 11-30-2014   OB History    Gravida Para Term Preterm AB TAB SAB Ectopic Multiple Living            0     Review of Systems  Constitutional: Positive for activity change.  Eyes: Positive for pain and redness.  Gastrointestinal: Negative for nausea.  Musculoskeletal: Negative for neck pain.  Allergic/Immunologic: Negative for immunocompromised state.  Neurological: Negative for headaches.      Allergies  Review of patient's allergies indicates no known allergies.  Home Medications   Prior to Admission medications   Medication Sig Start Date End Date Taking? Authorizing Provider  acyclovir (ZOVIRAX) 400 MG tablet Take 400 mg by mouth 2 (two) times daily.   Yes Historical Provider, MD  albuterol (PROVENTIL HFA;VENTOLIN HFA) 108 (90 BASE) MCG/ACT inhaler Inhale 2 puffs into the lungs every 6 (six) hours as needed for wheezing or shortness of breath. 01/23/15  Yes Soyla Dryer, PA-C  atenolol (TENORMIN) 25 MG tablet Take 25 mg by mouth daily.   Yes Historical Provider, MD  atorvastatin (LIPITOR) 10 MG tablet  Take 1 tablet (10 mg total) by mouth daily. 02/06/15  Yes Soyla Dryer, PA-C  chlordiazePOXIDE (LIBRIUM) 10 MG capsule Take 25 mg by mouth 2 (two) times daily.    Yes Historical Provider, MD  cloNIDine (CATAPRES) 0.1 MG tablet Take 0.1 mg by mouth at bedtime.    Yes Historical Provider, MD  gabapentin (NEURONTIN) 400 MG capsule Take 400-800 mg by mouth 2 (two) times daily. 1 in morning and 2 at bedtime   Yes Historical Provider, MD  levonorgestrel-ethinyl estradiol (SEASONALE,INTROVALE,JOLESSA) 0.15-0.03 MG tablet Take 1 tablet by mouth daily.   Yes Historical Provider, MD  mometasone-formoterol (DULERA) 100-5 MCG/ACT AERO Inhale 2 puffs into the lungs 2 (two) times daily. 01/23/15  Yes Soyla Dryer, PA-C  ranitidine (ZANTAC) 300 MG tablet Take 300 mg by mouth daily.   Yes Historical Provider, MD  albuterol (PROVENTIL) (2.5 MG/3ML) 0.083% nebulizer solution Take 3 mLs (2.5 mg total) by nebulization every 6 (six) hours as needed for wheezing or shortness of breath. 01/15/15   Soyla Dryer, PA-C  erythromycin ophthalmic ointment Place a 1/2 inch ribbon of ointment into the lower eyelid. 03/14/15   Varney Biles, MD  ibuprofen (ADVIL,MOTRIN) 800 MG tablet Take 1 tablet (800 mg total) by mouth every 8 (eight) hours as needed. Patient taking differently: Take 800 mg by mouth every 8 (eight) hours as needed for mild pain.  02/06/15   Soyla Dryer, PA-C  Ketorolac Tromethamine 0.45 % SOLN Apply 1 drop to eye 4 (four) times daily as needed. 03/14/15   Varney Biles, MD  promethazine (PHENERGAN) 25 MG tablet Take 25 mg by mouth daily as needed for nausea or vomiting.    Historical Provider, MD   BP 127/93 mmHg  Pulse 80  Temp(Src) 97.4 F (36.3 C) (Oral)  Resp 18  Ht 5\' 1"  (1.549 m)  Wt 145 lb (65.772 kg)  BMI 27.41 kg/m2  SpO2 100%  LMP 02/23/2015 Physical Exam  Constitutional: She is oriented to person, place, and time. She appears well-developed.  HENT:  Head: Normocephalic and atraumatic.  Eyes: EOM and lids are normal. Pupils are equal, round, and reactive to light. Lids are everted and swept, no foreign bodies found. Right eye exhibits no chemosis. Left eye exhibits chemosis. Left eye exhibits no discharge and no exudate. No foreign body present in the left eye. Left conjunctiva is injected. Left conjunctiva has no hemorrhage. No scleral icterus. Left eye exhibits no nystagmus.    flurocein dye under woods lamp reveals a clear horizontal corneal abrasion (dye uptake). VISUAL ACUITY REFUSED. No consensual photophobia   Neck: Normal range of motion. Neck supple.  Cardiovascular: Normal rate.   Pulmonary/Chest: Effort normal.  Abdominal: Bowel sounds are normal.    Neurological: She is alert and oriented to person, place, and time.  Skin: Skin is warm and dry.  Nursing note and vitals reviewed.   ED Course  Procedures (including critical care time) Labs Review Labs Reviewed - No data to display  Imaging Review No results found. I have personally reviewed and evaluated these images and lab results as part of my medical decision-making.   EKG Interpretation None      MDM   Final diagnoses:  Corneal abrasion, left, initial encounter    Pt with L sided eye pain. Pt;s exam is consistent with corneal abrasion. She doesn't use contacts, will give erythromycin and toradol. She has no signs of uveitis. She refused visual acuity - but gross exam in the room shows normal vision (  she could see me clearly once eye numbed). Eye pressures not checked once the abrasion was discovered. WE will d/c. Optho f.u info provided.    Varney Biles, MD 03/16/15 [phone]

## 2015-03-21 ENCOUNTER — Other Ambulatory Visit: Payer: Self-pay | Admitting: Physician Assistant

## 2015-03-21 MED ORDER — ATENOLOL 25 MG PO TABS: 25.0000 mg | ORAL_TABLET | Freq: Every day | ORAL | Status: DC

## 2015-03-21 MED ORDER — RANITIDINE HCL 300 MG PO TABS: 300.0000 mg | ORAL_TABLET | Freq: Every day | ORAL | Status: DC

## 2015-05-07 ENCOUNTER — Ambulatory Visit: Payer: Self-pay | Admitting: Physician Assistant

## 2015-05-09 ENCOUNTER — Encounter: Payer: Self-pay | Admitting: Physician Assistant

## 2015-05-09 ENCOUNTER — Ambulatory Visit: Payer: Self-pay | Admitting: Physician Assistant

## 2015-05-09 VITALS — BP 108/70 | HR 91 | Temp 99.3°F | Ht 63.0 in | Wt 131.7 lb

## 2015-05-09 DIAGNOSIS — F17218 Nicotine dependence, cigarettes, with other nicotine-induced disorders: Secondary | ICD-10-CM

## 2015-05-09 DIAGNOSIS — R5382 Chronic fatigue, unspecified: Secondary | ICD-10-CM | POA: Insufficient documentation

## 2015-05-09 DIAGNOSIS — I10 Essential (primary) hypertension: Secondary | ICD-10-CM

## 2015-05-09 DIAGNOSIS — J449 Chronic obstructive pulmonary disease, unspecified: Secondary | ICD-10-CM

## 2015-05-09 DIAGNOSIS — E785 Hyperlipidemia, unspecified: Secondary | ICD-10-CM

## 2015-05-09 NOTE — Progress Notes (Signed)
BP 108/70 mmHg  Pulse 91  Temp(Src) 99.3 F (37.4 C)  Ht 5\' 3"  (1.6 m)  Wt 131 lb 11.2 oz (59.739 kg)  BMI 23.34 kg/m2  SpO2 96%   Subjective:    Patient ID: Rita Baldwin, female    DOB: [birth year], 30 y.o.   MRN: [id number]  HPI: Rita Baldwin is a 30 y.o. female presenting on 05/09/2015 for COPD and Hyperlipidemia   HPI   Pt is still seeing dr [facility] for [facility]  Pt is taking a fiber pill to help her cholesterol b/c she doesn't want to take a statin.  Pt is still smoking  Pt is in school for counseling and business  Relevant past medical, surgical, family and social history reviewed and updated as indicated. Interim medical history since our last visit reviewed. Allergies and medications reviewed and updated   Current outpatient prescriptions:  .  acyclovir (ZOVIRAX) 400 MG tablet, Take 400 mg by mouth 2 (two) times daily., Disp: , Rfl:  .  albuterol (PROVENTIL HFA;VENTOLIN HFA) 108 (90 BASE) MCG/ACT inhaler, Inhale 2 puffs into the lungs every 6 (six) hours as needed for wheezing or shortness of breath., Disp: 3 Inhaler, Rfl: 1 .  albuterol (PROVENTIL) (2.5 MG/3ML) 0.083% nebulizer solution, Take 3 mLs (2.5 mg total) by nebulization every 6 (six) hours as needed for wheezing or shortness of breath., Disp: 25 vial, Rfl: 3 .  chlordiazePOXIDE (LIBRIUM) 10 MG capsule, Take 25 mg by mouth 2 (two) times daily. , Disp: , Rfl:  .  cloNIDine (CATAPRES) 0.1 MG tablet, Take 0.1 mg by mouth at bedtime. , Disp: , Rfl:  .  gabapentin (NEURONTIN) 400 MG capsule, Take 400-800 mg by mouth 2 (two) times daily. 1 in morning and 2 at bedtime, Disp: , Rfl:  .  ibuprofen (ADVIL,MOTRIN) 800 MG tablet, Take 1 tablet (800 mg total) by mouth every 8 (eight) hours as needed. (Patient taking differently: Take 800 mg by mouth every 8 (eight) hours as needed for mild pain. ), Disp: 30 tablet, Rfl: 0 .  levonorgestrel-ethinyl estradiol (SEASONALE,INTROVALE,JOLESSA) 0.15-0.03 MG tablet, Take 1 tablet  by mouth daily., Disp: , Rfl:  .  mometasone-formoterol (DULERA) 100-5 MCG/ACT AERO, Inhale 2 puffs into the lungs 2 (two) times daily., Disp: 3 Inhaler, Rfl: 0 .  promethazine (PHENERGAN) 25 MG tablet, Take 25 mg by mouth daily as needed for nausea or vomiting., Disp: , Rfl:  .  ranitidine (ZANTAC) 300 MG tablet, Take 1 tablet (300 mg total) by mouth at bedtime., Disp: 30 tablet, Rfl: 3 .  atenolol (TENORMIN) 25 MG tablet, Take 1 tablet (25 mg total) by mouth daily. (Patient not taking: Reported on 05/09/2015), Disp: 30 tablet, Rfl: 3 .  atorvastatin (LIPITOR) 10 MG tablet, Take 1 tablet (10 mg total) by mouth daily. (Patient not taking: Reported on 05/09/2015), Disp: 90 tablet, Rfl: 1   Review of Systems  Constitutional: Positive for fatigue. Negative for fever, chills, diaphoresis, appetite change and unexpected weight change.  HENT: Positive for dental problem and sore throat. Negative for congestion, drooling, ear pain, facial swelling, hearing loss, mouth sores, sneezing, trouble swallowing and voice change.   Eyes: Negative for pain, discharge, redness, itching and visual disturbance.  Respiratory: Positive for cough, shortness of breath and wheezing. Negative for choking.   Cardiovascular: Negative for chest pain, palpitations and leg swelling.  Gastrointestinal: Negative for vomiting, abdominal pain, diarrhea, constipation and blood in stool.  Endocrine: Positive for cold intolerance. Negative for heat intolerance  and polydipsia.  Genitourinary: Negative for dysuria, hematuria and decreased urine volume.  Musculoskeletal: Positive for back pain and arthralgias. Negative for gait problem.  Skin: Negative for rash.  Allergic/Immunologic: Positive for environmental allergies.  Neurological: Negative for seizures, syncope, light-headedness and headaches.  Hematological: Negative for adenopathy.  Psychiatric/Behavioral: Positive for dysphoric mood. Negative for suicidal ideas and agitation.  The patient is nervous/anxious.     Per HPI unless specifically indicated above     Objective:    BP 108/70 mmHg  Pulse 91  Temp(Src) 99.3 F (37.4 C)  Ht 5\' 3"  (1.6 m)  Wt 131 lb 11.2 oz (59.739 kg)  BMI 23.34 kg/m2  SpO2 96%  Wt Readings from Last 3 Encounters:  05/09/15 131 lb 11.2 oz (59.739 kg)  03/15/15 134 lb 11.2 oz (61.1 kg)  03/14/15 145 lb (65.772 kg)    Physical Exam  Constitutional: She is oriented to person, place, and time. She appears well-developed and well-nourished.  HENT:  Head: Normocephalic and atraumatic.  Neck: Neck supple.  Cardiovascular: Normal rate and regular rhythm.   Pulmonary/Chest: Effort normal and breath sounds normal.  Abdominal: Soft. Bowel sounds are normal. She exhibits no mass. There is no hepatosplenomegaly. There is no tenderness.  Musculoskeletal: She exhibits no edema.  Lymphadenopathy:    She has no cervical adenopathy.  Neurological: She is alert and oriented to person, place, and time.  Skin: Skin is warm and dry.  Psychiatric: She has a normal mood and affect. Her behavior is normal.  Vitals reviewed.       Assessment & Plan:   Encounter Diagnoses  Name Primary?  . Hyperlipidemia Yes  . Nicotine dependence, cigarettes, with other nicotine-induced disorders   . Chronic obstructive pulmonary disease, unspecified COPD type ([place])   . Essential hypertension, benign   . Chronic fatigue       -Will wait another 2 months for a total of 3 on her fiber pill and then will check lipids before appt -no changes today -RTO sooner prn

## 2015-06-11 ENCOUNTER — Other Ambulatory Visit: Payer: Self-pay | Admitting: Physician Assistant

## 2015-07-03 ENCOUNTER — Other Ambulatory Visit: Payer: Self-pay

## 2015-07-03 DIAGNOSIS — E785 Hyperlipidemia, unspecified: Secondary | ICD-10-CM

## 2015-07-03 DIAGNOSIS — I10 Essential (primary) hypertension: Secondary | ICD-10-CM

## 2015-07-03 DIAGNOSIS — R5382 Chronic fatigue, unspecified: Secondary | ICD-10-CM

## 2015-07-05 LAB — LIPID PANEL
CHOL/HDL RATIO: 3.8 ratio (ref <=5.0)
Cholesterol: 188 mg/dL (ref 125–200)
HDL: 49 mg/dL (ref >=46)
LDL CALC: 118 mg/dL (ref <130)
Triglycerides: 105 mg/dL (ref <150)
VLDL: 21 mg/dL (ref <30)

## 2015-07-05 LAB — CBC
HCT: 41.3 % (ref 35.0–45.0)
HEMOGLOBIN: 13.7 g/dL (ref 11.7–15.5)
MCH: 31.2 pg (ref 27.0–33.0)
MCHC: 33.2 g/dL (ref 32.0–36.0)
MCV: 94.1 fL (ref 80.0–100.0)
MPV: 10.3 fL (ref 7.5–12.5)
PLATELETS: 258 10*3/uL (ref 140–400)
RBC: 4.39 MIL/uL (ref 3.80–5.10)
RDW: 13.8 % (ref 11.0–15.0)
WBC: 7.3 10*3/uL (ref 3.8–10.8)

## 2015-07-05 LAB — COMPLETE METABOLIC PANEL WITH GFR
ALBUMIN: 4.2 g/dL (ref 3.6–5.1)
ALK PHOS: 49 U/L (ref 33–115)
ALT: 27 U/L (ref 6–29)
AST: 21 U/L (ref 10–30)
BUN: 10 mg/dL (ref 7–25)
CO2: 18 mmol/L — AB (ref 20–31)
Calcium: 9 mg/dL (ref 8.6–10.2)
Chloride: 108 mmol/L (ref 98–110)
Creat: 0.64 mg/dL (ref 0.50–1.10)
GFR, Est African American: >89 mL/min (ref >=60)
GLUCOSE: 79 mg/dL (ref 65–99)
POTASSIUM: 4.3 mmol/L (ref 3.5–5.3)
SODIUM: 139 mmol/L (ref 135–146)
TOTAL PROTEIN: 6.7 g/dL (ref 6.1–8.1)
Total Bilirubin: 0.4 mg/dL (ref 0.2–1.2)

## 2015-07-09 ENCOUNTER — Ambulatory Visit: Payer: Self-pay | Admitting: Physician Assistant

## 2015-07-09 ENCOUNTER — Encounter: Payer: Self-pay | Admitting: Physician Assistant

## 2015-07-09 VITALS — BP 100/64 | HR 89 | Temp 98.2°F | Ht 63.0 in | Wt 135.0 lb

## 2015-07-09 DIAGNOSIS — E785 Hyperlipidemia, unspecified: Secondary | ICD-10-CM

## 2015-07-09 DIAGNOSIS — K219 Gastro-esophageal reflux disease without esophagitis: Secondary | ICD-10-CM

## 2015-07-09 DIAGNOSIS — J449 Chronic obstructive pulmonary disease, unspecified: Secondary | ICD-10-CM

## 2015-07-09 DIAGNOSIS — F17218 Nicotine dependence, cigarettes, with other nicotine-induced disorders: Secondary | ICD-10-CM

## 2015-07-09 MED ORDER — RANITIDINE HCL 300 MG PO TABS: 300.0000 mg | ORAL_TABLET | Freq: Every day | ORAL | Status: DC

## 2015-07-09 MED ORDER — IBUPROFEN 800 MG PO TABS: 800.0000 mg | ORAL_TABLET | Freq: Three times a day (TID) | ORAL | Status: DC | PRN

## 2015-07-09 NOTE — Progress Notes (Signed)
BP 100/64 mmHg  Pulse 89  Temp(Src) 98.2 F (36.8 C)  Ht 5\' 3"  (1.6 m)  Wt 135 lb (61.236 kg)  BMI 23.92 kg/m2  SpO2 99%   Subjective:    Patient ID: Rita Baldwin, female    DOB: February 21, 1986, 30 y.o.   MRN: [id number]  HPI: Rita Baldwin is a 30 y.o. female presenting on 07/09/2015 for Hyperlipidemia and Hypertension   HPI Chief Complaint  Patient presents with  . Hyperlipidemia    pt states she is taking a daily fiber pill in place of Lipitor  . Hypertension   Pt is still in school.  Pt is still seeing Dr Toy Care in [place], her psychiatrist.    She says she has stopped drinking- last etoh was in march.    Relevant past medical, surgical, family and social history reviewed and updated as indicated. Interim medical history since our last visit reviewed. Allergies and medications reviewed and updated.   Current outpatient prescriptions:  .  acyclovir (ZOVIRAX) 400 MG tablet, Take 400 mg by mouth 2 (two) times daily., Disp: , Rfl:  .  albuterol (PROVENTIL HFA;VENTOLIN HFA) 108 (90 BASE) MCG/ACT inhaler, Inhale 2 puffs into the lungs every 6 (six) hours as needed for wheezing or shortness of breath., Disp: 3 Inhaler, Rfl: 1 .  albuterol (PROVENTIL) (2.5 MG/3ML) 0.083% nebulizer solution, Take 3 mLs (2.5 mg total) by nebulization every 6 (six) hours as needed for wheezing or shortness of breath., Disp: 25 vial, Rfl: 3 .  chlordiazePOXIDE (LIBRIUM) 10 MG capsule, Take 25 mg by mouth 2 (two) times daily. , Disp: , Rfl:  .  cloNIDine (CATAPRES) 0.1 MG tablet, Take 0.1 mg by mouth at bedtime. , Disp: , Rfl:  .  FIBER PO, Take by mouth., Disp: , Rfl:  .  gabapentin (NEURONTIN) 400 MG capsule, Take 400-1,200 mg by mouth 2 (two) times daily. 400 mg in morning and 1200 mg at bedtime, Disp: , Rfl:  .  levonorgestrel-ethinyl estradiol (SEASONALE,INTROVALE,JOLESSA) 0.15-0.03 MG tablet, Take 1 tablet by mouth daily., Disp: , Rfl:  .  mometasone-formoterol (DULERA) 100-5 MCG/ACT  AERO, Inhale 2 puffs into the lungs 2 (two) times daily., Disp: 3 Inhaler, Rfl: 0 .  ranitidine (ZANTAC) 300 MG tablet, Take 1 tablet (300 mg total) by mouth at bedtime., Disp: 30 tablet, Rfl: 3   Review of Systems  Constitutional: Positive for fatigue. Negative for fever, chills, diaphoresis, appetite change and unexpected weight change.  HENT: Positive for congestion and dental problem. Negative for drooling, ear pain, facial swelling, hearing loss, mouth sores, sneezing, sore throat, trouble swallowing and voice change.   Eyes: Negative for pain, discharge, redness, itching and visual disturbance.  Respiratory: Negative for cough, choking, shortness of breath and wheezing.   Cardiovascular: Negative for chest pain, palpitations and leg swelling.  Gastrointestinal: Negative for vomiting, abdominal pain, diarrhea, constipation and blood in stool.  Endocrine: Negative for cold intolerance, heat intolerance and polydipsia.  Genitourinary: Negative for dysuria, hematuria and decreased urine volume.  Musculoskeletal: Positive for back pain and arthralgias. Negative for gait problem.  Skin: Negative for rash.  Allergic/Immunologic: Negative for environmental allergies.  Neurological: Positive for headaches. Negative for seizures, syncope and light-headedness.  Hematological: Negative for adenopathy.  Psychiatric/Behavioral: Positive for dysphoric mood and agitation. Negative for suicidal ideas. The patient is nervous/anxious.     Per HPI unless specifically indicated above     Objective:    BP 100/64 mmHg  Pulse 89  Temp(Src) 98.2 F (  36.8 C)  Ht 5\' 3"  (1.6 m)  Wt 135 lb (61.236 kg)  BMI 23.92 kg/m2  SpO2 99%  Wt Readings from Last 3 Encounters:  07/09/15 135 lb (61.236 kg)  05/09/15 131 lb 11.2 oz (59.739 kg)  03/15/15 134 lb 11.2 oz (61.1 kg)    Physical Exam  Constitutional: She is oriented to person, place, and time. She appears well-developed and well-nourished.  HENT:   Head: Normocephalic and atraumatic.  Neck: Neck supple.  Cardiovascular: Normal rate and regular rhythm.   Pulmonary/Chest: Effort normal and breath sounds normal.  Abdominal: Soft. Bowel sounds are normal. She exhibits no mass. There is no hepatosplenomegaly. There is no tenderness.  Musculoskeletal: She exhibits no edema.  Lymphadenopathy:    She has no cervical adenopathy.  Neurological: She is alert and oriented to person, place, and time.  Skin: Skin is warm and dry.  Psychiatric: She has a normal mood and affect. Her behavior is normal.  Vitals reviewed.   Results for orders placed or performed in visit on 07/03/15  CBC  Result Value Ref Range   WBC 7.3 3.8 - 10.8 K/uL   RBC 4.39 3.80 - 5.10 MIL/uL   Hemoglobin 13.7 11.7 - 15.5 g/dL   HCT 41.3 35.0 - 45.0 %   MCV 94.1 80.0 - 100.0 fL   MCH 31.2 27.0 - 33.0 pg   MCHC 33.2 32.0 - 36.0 g/dL   RDW 13.8 11.0 - 15.0 %   Platelets 258 140 - 400 K/uL   MPV 10.3 7.5 - 12.5 fL  COMPLETE METABOLIC PANEL WITH GFR  Result Value Ref Range   Sodium 139 135 - 146 mmol/L   Potassium 4.3 3.5 - 5.3 mmol/L   Chloride 108 98 - 110 mmol/L   CO2 18 (L) 20 - 31 mmol/L   Glucose, Bld 79 65 - 99 mg/dL   BUN 10 7 - 25 mg/dL   Creat 0.64 0.50 - 1.10 mg/dL   Total Bilirubin 0.4 0.2 - 1.2 mg/dL   Alkaline Phosphatase 49 33 - 115 U/L   AST 21 10 - 30 U/L   ALT 27 6 - 29 U/L   Total Protein 6.7 6.1 - 8.1 g/dL   Albumin 4.2 3.6 - 5.1 g/dL   Calcium 9.0 8.6 - 10.2 mg/dL   GFR, Est African American >89 >=60 mL/min   GFR, Est Non African American >89 >=60 mL/min  Lipid Profile  Result Value Ref Range   Cholesterol 188 125 - 200 mg/dL   Triglycerides 105 <150 mg/dL   HDL 49 >=46 mg/dL   Total CHOL/HDL Ratio 3.8 <=5.0 Ratio   VLDL 21 <30 mg/dL   LDL Cholesterol 118 <130 mg/dL      Assessment & Plan:   Encounter Diagnoses  Name Primary?  . Chronic obstructive pulmonary disease, unspecified COPD type ([place]) Yes  . Hyperlipidemia   .  Gastroesophageal reflux disease, esophagitis presence not specified   . Nicotine dependence, cigarettes, with other nicotine-induced disorders     Cholesterol- pt will control with diet, exercise and fiber Bipolar disorder- pt to continue with Dr Toy Care in [place] for treatment of this condition COPD/smoking- continue current inhalers.  Pt counseled on smoking cessation GERD- continue zantac -f/u 6 months.  RTO sooner prn

## 2016-01-08 ENCOUNTER — Ambulatory Visit: Payer: Self-pay | Admitting: Physician Assistant

## 2016-08-23 IMAGING — DX DG CHEST 2V
2 series · 2 of 2 positions shown · non-contrast
Comparison: Chest radiograph from 11/29/2012

CLINICAL DATA: Acute onset of cough and chills.  Initial encounter.

EXAM:
CHEST  2 VIEW

[chest pa]
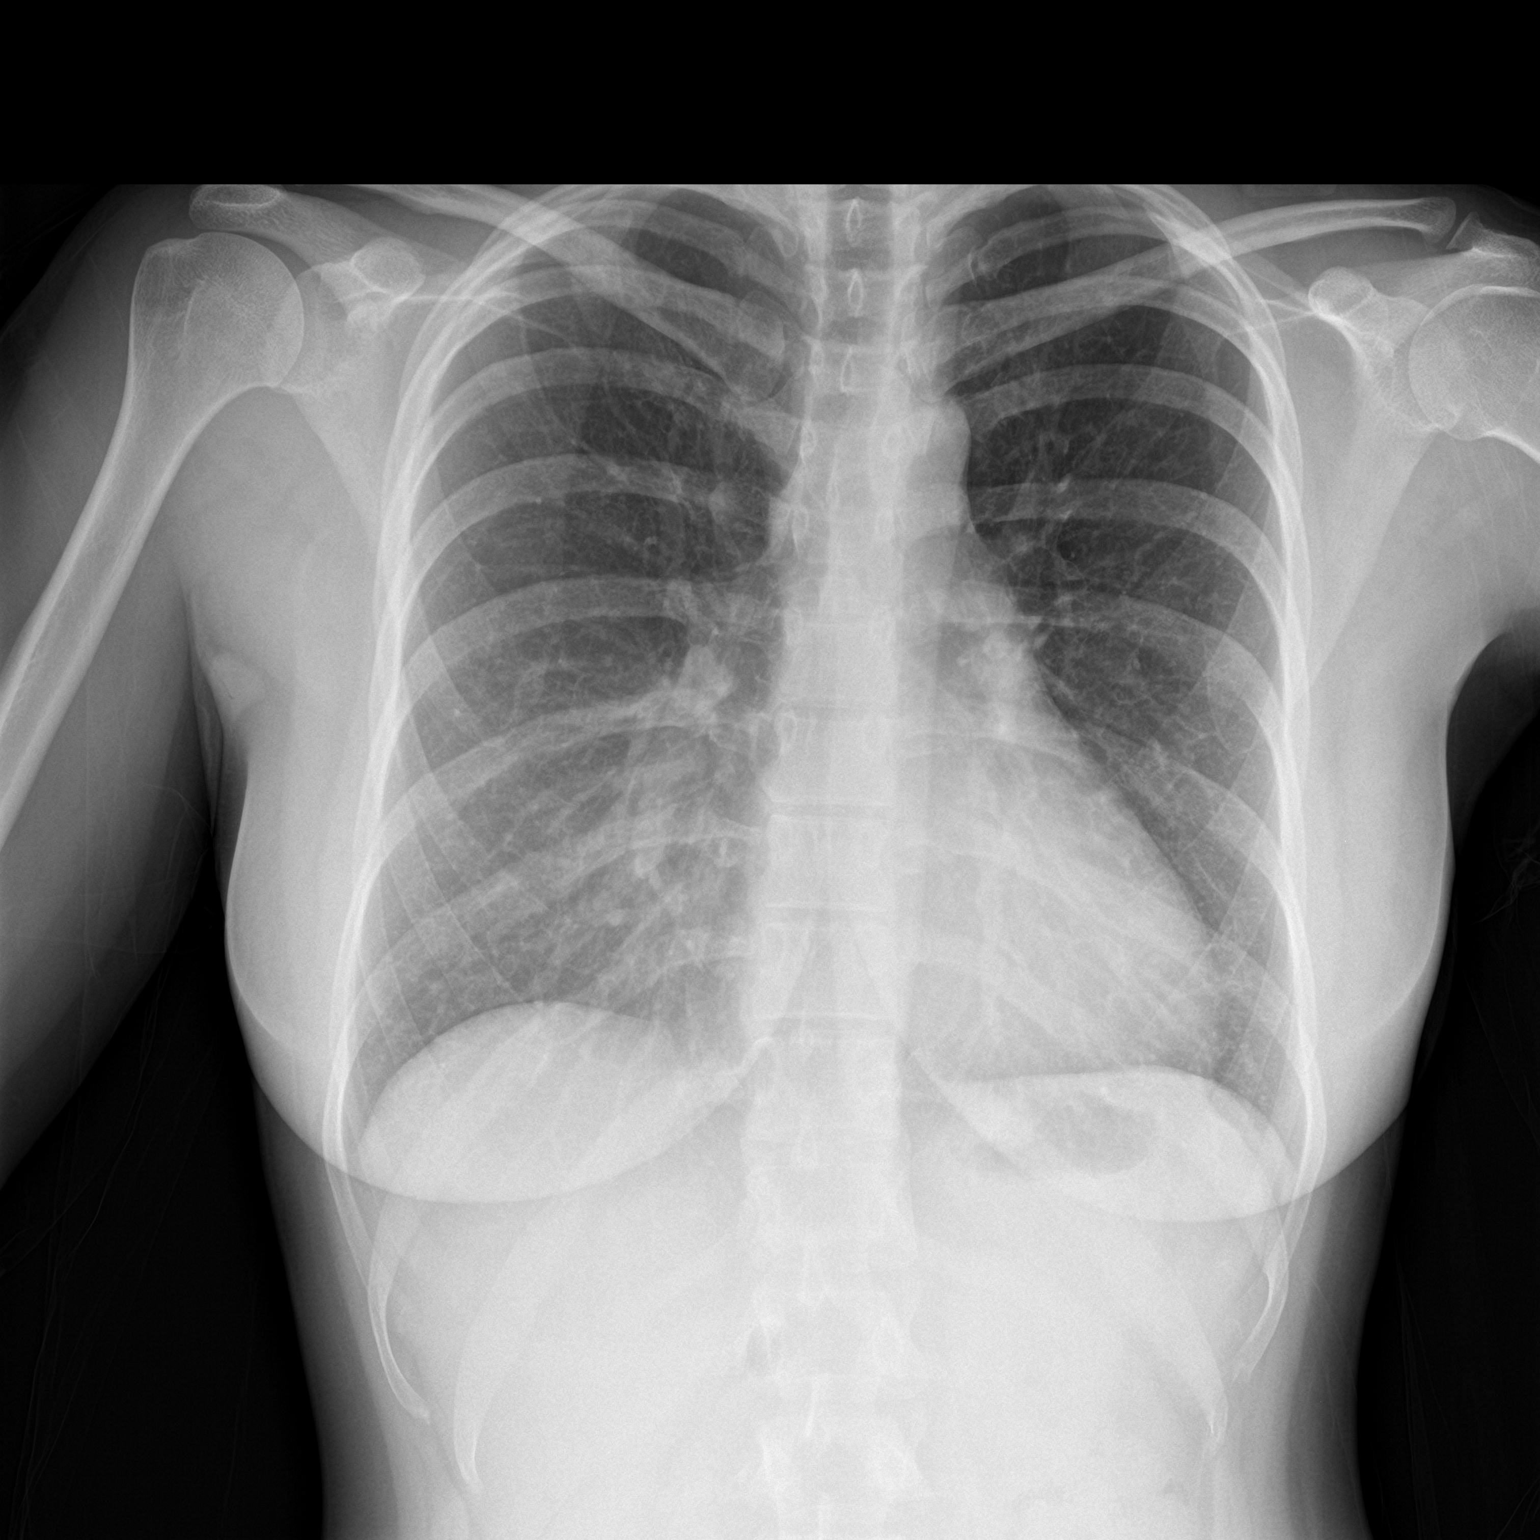

[chest lat]
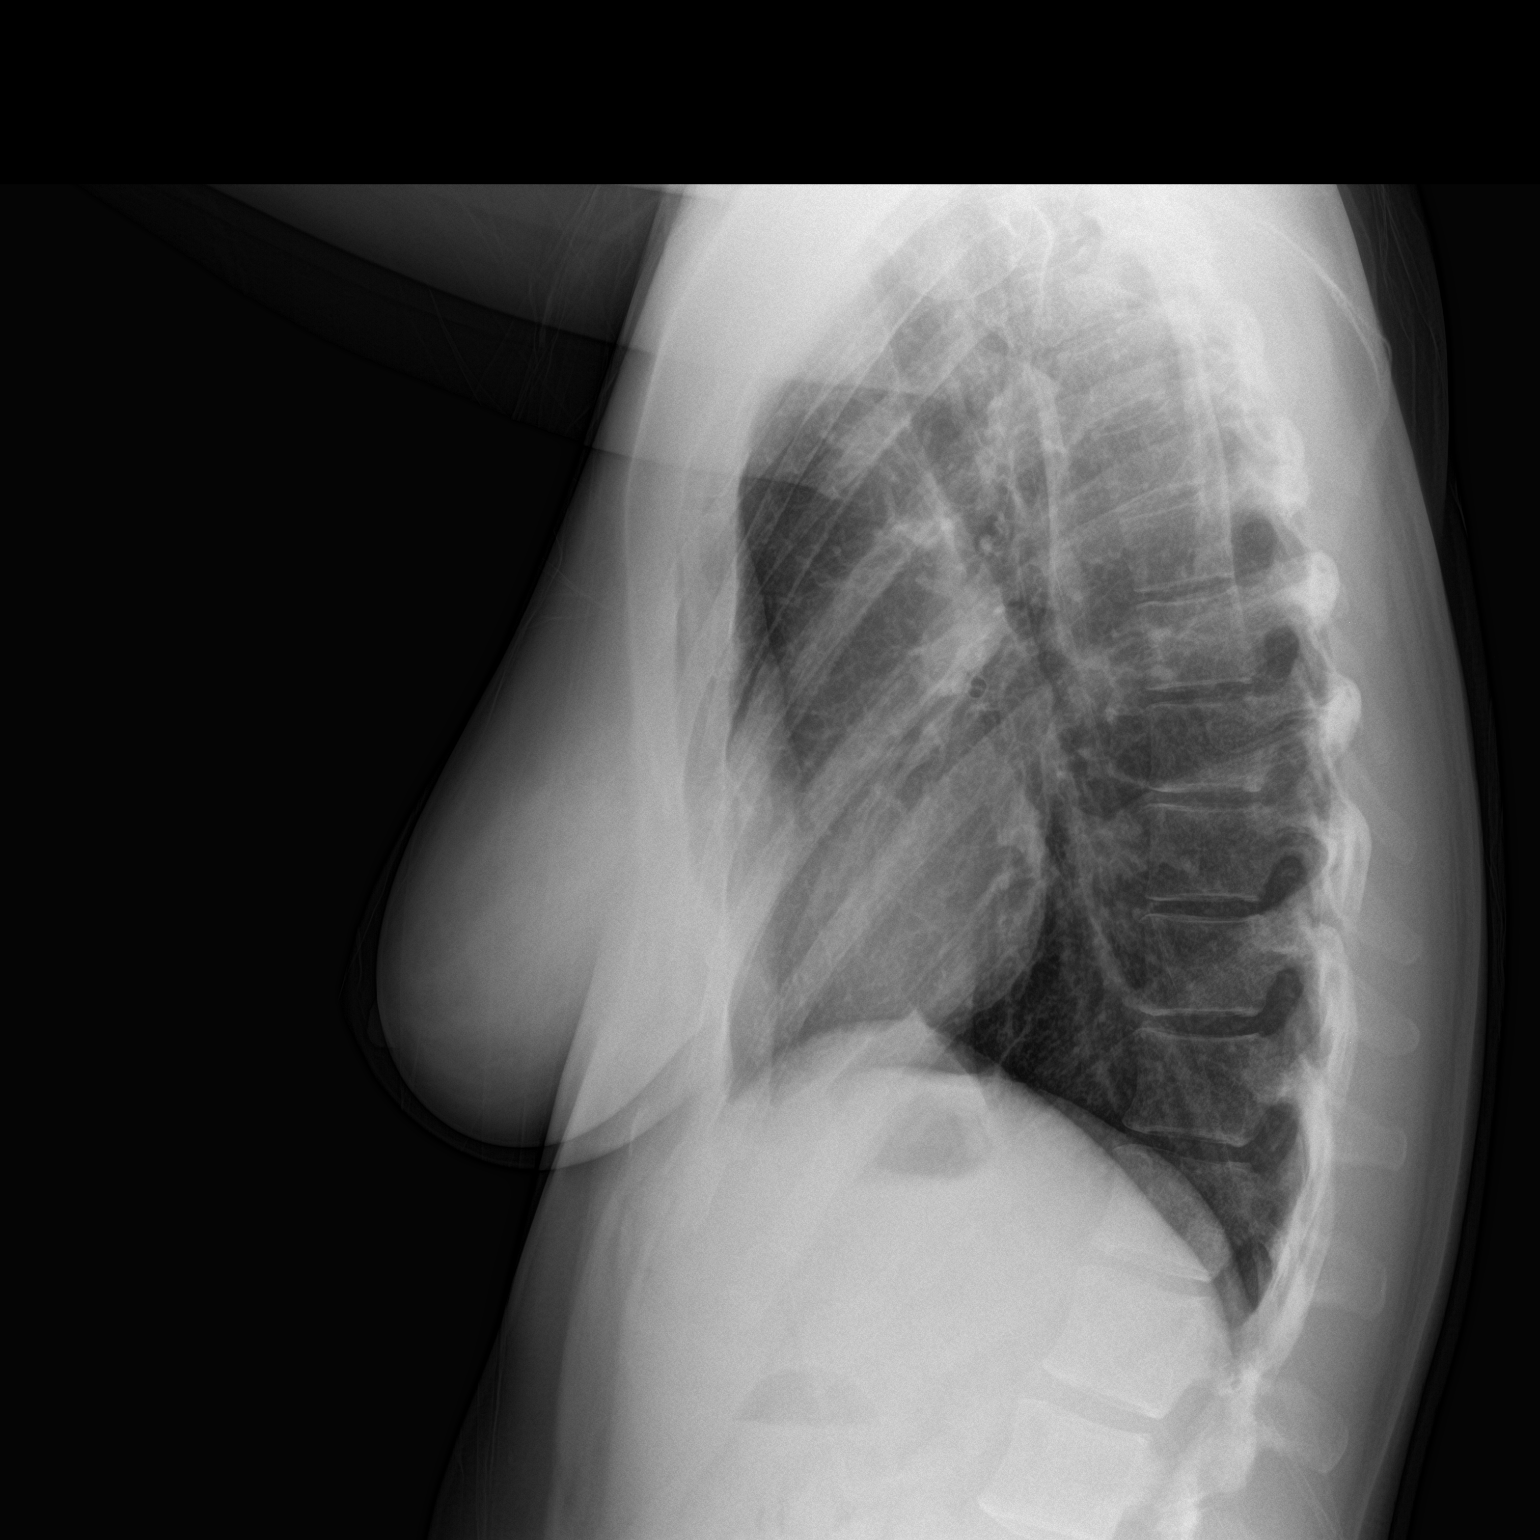

[2 of 2 positions shown; findings below may reference images not displayed]

FINDINGS: The lungs are well-aerated and clear. There is no evidence of focal
opacification, pleural effusion or pneumothorax.

The heart is normal in size; the mediastinal contour is within
normal limits. No acute osseous abnormalities are seen.
IMPRESSION: No acute cardiopulmonary process seen.
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
## 2018-01-14 ENCOUNTER — Ambulatory Visit (INDEPENDENT_AMBULATORY_CARE_PROVIDER_SITE_OTHER): Payer: Medicaid Other | Admitting: Psychiatry

## 2018-01-14 ENCOUNTER — Encounter (INDEPENDENT_AMBULATORY_CARE_PROVIDER_SITE_OTHER): Payer: Self-pay

## 2018-01-14 VITALS — BP 127/88 | HR 87 | Ht 63.0 in | Wt 127.0 lb

## 2018-01-14 DIAGNOSIS — F331 Major depressive disorder, recurrent, moderate: Secondary | ICD-10-CM

## 2018-01-14 DIAGNOSIS — F411 Generalized anxiety disorder: Secondary | ICD-10-CM

## 2018-01-14 DIAGNOSIS — F1121 Opioid dependence, in remission: Secondary | ICD-10-CM | POA: Diagnosis not present

## 2018-01-14 NOTE — Patient Instructions (Signed)
1. Return to clinic as needed 2. Medication to consider- Venlafaxine, Duloxetine, nortriptyline

## 2019-09-12 ENCOUNTER — Encounter (INDEPENDENT_AMBULATORY_CARE_PROVIDER_SITE_OTHER): Payer: Self-pay | Admitting: Gastroenterology

## 2019-10-10 ENCOUNTER — Ambulatory Visit (INDEPENDENT_AMBULATORY_CARE_PROVIDER_SITE_OTHER): Payer: Self-pay | Admitting: Gastroenterology

## 2019-10-20 ENCOUNTER — Other Ambulatory Visit (INDEPENDENT_AMBULATORY_CARE_PROVIDER_SITE_OTHER): Payer: Self-pay | Admitting: *Deleted

## 2019-10-20 ENCOUNTER — Ambulatory Visit (INDEPENDENT_AMBULATORY_CARE_PROVIDER_SITE_OTHER): Payer: Medicaid Other | Admitting: Gastroenterology

## 2019-10-20 ENCOUNTER — Telehealth (INDEPENDENT_AMBULATORY_CARE_PROVIDER_SITE_OTHER): Payer: Self-pay | Admitting: *Deleted

## 2019-10-20 ENCOUNTER — Encounter (INDEPENDENT_AMBULATORY_CARE_PROVIDER_SITE_OTHER): Payer: Self-pay | Admitting: Gastroenterology

## 2019-10-20 ENCOUNTER — Encounter (INDEPENDENT_AMBULATORY_CARE_PROVIDER_SITE_OTHER): Payer: Self-pay | Admitting: *Deleted

## 2019-10-20 ENCOUNTER — Other Ambulatory Visit: Payer: Self-pay

## 2019-10-20 VITALS — BP 121/81 | HR 91 | Temp 98.3°F | Ht 63.0 in | Wt 151.3 lb

## 2019-10-20 DIAGNOSIS — K219 Gastro-esophageal reflux disease without esophagitis: Secondary | ICD-10-CM | POA: Diagnosis not present

## 2019-10-20 DIAGNOSIS — K625 Hemorrhage of anus and rectum: Secondary | ICD-10-CM | POA: Diagnosis not present

## 2019-10-20 MED ORDER — OMEPRAZOLE 40 MG PO CPDR: 40.0000 mg | DELAYED_RELEASE_CAPSULE | Freq: Every day | ORAL | 3 refills | Status: DC

## 2019-10-20 MED ORDER — SUPREP BOWEL PREP KIT 17.5-3.13-1.6 GM/177ML PO SOLN
1.0000 | Freq: Once | ORAL | 0 refills | Status: AC
Start: 2019-10-20 — End: 2019-10-20

## 2019-10-20 NOTE — Patient Instructions (Signed)
Schedule EGD and colonoscopy Start taking Miralax 1 cap every day for one week. If bowel movements do not improve, increase to 1 cup every 12 hours. If after two weeks there is no improvement, increase to 1 cup every 8 hours Start Anusol or Preparation H suppository as  Needed for rectal bleeding Take prune juice and eat kiwi daily

## 2019-10-20 NOTE — Telephone Encounter (Signed)
Patient needs suprep 

## 2019-10-20 NOTE — Progress Notes (Signed)
Maylon Peppers, M.D. Gastroenterology & Hepatology [facility] [address] [place], [place] [address] Primary Care Physician: Manon Hilding, MD [place] [address]  Referring MD: PCP  I will communicate my assessment and recommendations to the referring MD via EMR. Note: Occasional unusual wording and randomly placed punctuation marks may result from the use of speech recognition technology to transcribe this document"  Chief Complaint: Rectal bleeding and rectal discomfort  History of Present Illness: Rita Baldwin is a 34 y.o. 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  The patient states that for the last month she has had episodes of rectal bleeding (total of 3 episodes) characterized as fresh blood in the top of her stool and when wiping.  She reports that she has had similar symptoms for the last couple of years but they have been more frequent the last few months.  She reports that she has been having a BM every week for the last month, which is unusual for her as she usually has a BM every 1-2 days. Never had melena or hematemesis. Patient reports that she has had to strain significantly to have a bowel movement.  In fact, she has had to do manual maneuvers to remove the stool. Patient does not take any laxatives, but she reports she used to take it during her pregnancy (colace).  She had a C-section. she describes her stool as "normal consistency". She states that after having a BM she has some dyschezia and lower abdominal pain. Patient reports having an episode of abdominal pain in the lower perineal area after having intercourse. She has constant pressure sensation in her abdomen if she has not had a BM. Last BM was 3 days ago - no blood was present.  The patient also reports that she had a  thrombosed hemorrhoid during her pregnancy, for which she received topical treatment with hydrocortisone. She never had a problem until she was pregnant in 2018.  On the other hand, the patient reports that she has had GERD for the last 10 years. Initially she was on Nexium, then switched to Zantac which controlled her symptoms completely but she had to switch as the medication was recalled. Currently takes famotidine 40 mg qday but endorses her symptoms are still present on a daily basis, worse at night characterizes heartburn episodes.  Due to this, she started using Protonix 2 weeks ago.  She reports that she takes it fasting and waits 30-40 minutes to eat.   The patient denies having any nausea, vomiting, fever, chills, abdominal distention, abdominal pain, diarrhea, jaundice, pruritus or weight loss.  Last VPX:TGGYI Last Colonoscopy:never  FHx: neg for any gastrointestinal/liver disease, mother breast cancer, uncle cirrhosis of liver Social: smokes one pack day, drinks 1 bottle of wine daily, patient used to do heroin/cocaine - quit 7 years ago Surgical: C-section  Past Medical History: Past Medical History:  Diagnosis Date  . Anxiety 2012  . Bronchitis   . Chlamydia 2009  . COPD (chronic obstructive pulmonary disease) ([place])   . Drug abuse ([place])   . GERD (gastroesophageal reflux disease) age 18  . Headache   . Hypertension 2015  . Overdose    "I overdosed on tylenol as a kid"  . Precancerous changes of the cervix as a child  . PTSD (post-traumatic  stress disorder)   . Scoliosis fifth grade  . UTI (lower urinary tract infection)     Past Surgical History: Past Surgical History:  Procedure Laterality Date  . MULTIPLE TOOTH EXTRACTIONS      Family History: Family History  Problem Relation Age of Onset  . Cancer Mother        breast  . Hypertension Mother   . Asthma Father   . Diabetes Father   . Heart disease Father   . Hyperlipidemia Father   . Hypertension Father    . Cirrhosis Paternal Uncle   . Cancer Maternal Aunt        melanoma  . Diabetes Maternal Grandmother   . Diabetes Maternal Grandfather   . Diabetes Paternal Grandmother   . Diabetes Paternal Grandfather     Social History: Social History   Tobacco Use  Smoking Status Current Every Day Smoker  . Packs/day: 0.50  . Years: 9.00  . Pack years: 4.50  . Types: Cigarettes, E-cigarettes  Smokeless Tobacco Never Used   Social History   Substance and Sexual Activity  Alcohol Use Yes   Comment: hx alcoholism. sober since 05-28-15   Social History   Substance and Sexual Activity  Drug Use No  . Frequency: 3.0 times per week  . Types: Marijuana, IV, Heroin, Cocaine, Morphine, Oxycodone, Codeine, Fentanyl, Hydrocodone   Comment: former IV drug user.   none since 2011    Allergies: Allergies  Allergen Reactions  . Morphine Hives    Medications: Current Outpatient Medications  Medication Sig Dispense Refill  . albuterol (PROVENTIL HFA;VENTOLIN HFA) 108 (90 BASE) MCG/ACT inhaler Inhale 2 puffs into the lungs every 6 (six) hours as needed for wheezing or shortness of breath. 3 Inhaler 1  . albuterol (PROVENTIL) (2.5 MG/3ML) 0.083% nebulizer solution Take 3 mLs (2.5 mg total) by nebulization every 6 (six) hours as needed for wheezing or shortness of breath. 25 vial 3  . ALPRAZolam (XANAX) 0.25 MG tablet Take 0.25 mg by mouth 3 (three) times daily as needed.    . cloNIDine (CATAPRES) 0.1 MG tablet Take 0.1 mg by mouth daily.    . cyclobenzaprine (FLEXERIL) 10 MG tablet Take 10 mg by mouth at bedtime as needed.    . DULoxetine (CYMBALTA) 60 MG capsule Take 60 mg by mouth daily.    Marland Kitchen gabapentin (NEURONTIN) 600 MG tablet Take 600 mg by mouth 3 (three) times daily.    . hydrocortisone 2.5 % cream SMARTSIG:1 Sparingly Topical 3 Times Daily    . hydrOXYzine (ATARAX/VISTARIL) 25 MG tablet Take 25 mg by mouth 4 (four) times daily as needed.    . meloxicam (MOBIC) 15 MG tablet Take 15 mg by  mouth daily.    . pantoprazole (PROTONIX) 40 MG tablet SMARTSIG:1 Tablet(s) By Mouth    . estradiol cypionate (DEPO-ESTRADIOL) 5 MG/ML injection Inject into the muscle every 28 (twenty-eight) days. (Patient not taking: Reported on 10/20/2019)    . ibuprofen (ADVIL,MOTRIN) 800 MG tablet Take 800 mg by mouth 3 (three) times daily as needed. (Patient not taking: Reported on 10/20/2019)    . ondansetron (ZOFRAN) 8 MG tablet Take 8 mg by mouth as needed for nausea or vomiting. (Patient not taking: Reported on 10/20/2019)     No current facility-administered medications for this visit.    Review of Systems: GENERAL: negative for malaise, night sweats HEENT: No changes in hearing or vision, no nose bleeds or other nasal problems. NECK: Negative for lumps, goiter, pain and significant  neck swelling RESPIRATORY: Negative for cough, wheezing CARDIOVASCULAR: Negative for chest pain, leg swelling, palpitations, orthopnea GI: SEE HPI MUSCULOSKELETAL: Negative for joint pain or swelling, back pain, and muscle pain. SKIN: Negative for lesions, rash PSYCH: Negative for sleep disturbance, mood disorder and recent psychosocial stressors. HEMATOLOGY Negative for prolonged bleeding, bruising easily, and swollen nodes. ENDOCRINE: Negative for cold or heat intolerance, polyuria, polydipsia and goiter. NEURO: negative for tremor, gait imbalance, syncope and seizures. The remainder of the review of systems is noncontributory.   Physical Exam: BP 121/81 (BP Location: Right Arm, Patient Position: Sitting, Cuff Size: Normal)   Pulse 91   Temp 98.3 F (36.8 C) (Oral)   Ht 5\' 3"  (1.6 m)   Wt 151 lb 4.8 oz (68.6 kg)   BMI 26.80 kg/m  GENERAL: The patient is AO x3, in no acute distress. HEENT: Head is normocephalic and atraumatic. EOMI are intact. Mouth is well hydrated and without lesions. NECK: Supple. No masses LUNGS: Clear to auscultation. No presence of rhonchi/wheezing/rales. Adequate chest expansion HEART:  RRR, normal s1 and s2. ABDOMEN: Soft, nontender, no guarding, no peritoneal signs, and nondistended. BS +. No masses. EXTREMITIES: Without any cyanosis, clubbing, rash, lesions or edema. NEUROLOGIC: AOx3, no focal motor deficit. SKIN: no jaundice, no rashes   Imaging/Labs: as above  I personally reviewed and interpreted the available labs, imaging and endoscopic files.  Impression and Plan: Rita Baldwin is a 34 y.o. 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  The patient has had recurrent episodes of rectal bleeding in the past which seem to be due to changing her bowel movement frequency.  It is likely that the bleeding is secondary to hemorrhoid or an anal fissure due to the presence of dyschezia, for which the patient will benefit of implementing the use of Preparation H as well as starting MiraLAX to soften her bowel movements. She would also benefit from implementing dietary changes. Nevertheless, we will proceed with colonoscopy to evaluate any other sources of lower gastrointestinal bleeding.  Also, her constipation may be related to narcotic bowel she has a history of previous ring use, even though she has not used any opiates for the last 7 years.  MiraLAX may help with this.  Finally, the patient has presented recurrent heartburn episodes despite taking PPI and Pepcid at night.  I will switch to a higher potency PPI (omeprazole) and she can take the Pepcid at night as needed for breakthrough episodes.  We will proceed with an EGD to evaluate her persistent heartburn.  -Schedule EGD and colonoscopy -Start omeprazole 40 mg q day and stop Protonix -Pepcid 40 mg at bedtime as needed for breakthrough symptoms -Start taking Miralax 1 cap every day for one week. If bowel movements do not improve, increase to 1 cup every 12 hours. If after two weeks there is no  improvement, increase to 1 cup every 8 hours -Start Anusol or Preparation H suppository as  Needed for rectal bleeding -Take prune juice and eat kiwi daily  All questions were answered.      Maylon Peppers, MD Gastroenterology and Hepatology [facility]

## 2019-10-25 ENCOUNTER — Encounter (HOSPITAL_COMMUNITY)
Admission: RE | Admit: 2019-10-25 | Discharge: 2019-10-25 | Disposition: A | Discharge status: Home / Self Care | Payer: Medicaid Other | Source: Ambulatory Visit | Attending: Gastroenterology | Admitting: Gastroenterology

## 2019-10-25 ENCOUNTER — Other Ambulatory Visit: Payer: Self-pay

## 2019-10-31 ENCOUNTER — Encounter (HOSPITAL_COMMUNITY)
Admission: RE | Admit: 2019-10-31 | Discharge: 2019-10-31 | Disposition: A | Discharge status: Home / Self Care | Payer: Medicaid Other | Source: Ambulatory Visit | Attending: Gastroenterology | Admitting: Gastroenterology

## 2019-10-31 ENCOUNTER — Other Ambulatory Visit (HOSPITAL_COMMUNITY)
Admission: RE | Admit: 2019-10-31 | Discharge: 2019-10-31 | Disposition: A | Discharge status: Home / Self Care | Payer: Medicaid Other | Source: Ambulatory Visit | Attending: Gastroenterology | Admitting: Gastroenterology

## 2019-10-31 ENCOUNTER — Other Ambulatory Visit: Payer: Self-pay

## 2019-10-31 DIAGNOSIS — Z01812 Encounter for preprocedural laboratory examination: Secondary | ICD-10-CM | POA: Insufficient documentation

## 2019-10-31 DIAGNOSIS — Z20822 Contact with and (suspected) exposure to covid-19: Secondary | ICD-10-CM | POA: Insufficient documentation

## 2019-10-31 LAB — PREGNANCY, URINE: Preg Test, Ur: NEGATIVE

## 2019-11-01 LAB — SARS CORONAVIRUS 2 (TAT 6-24 HRS): SARS Coronavirus 2: NEGATIVE

## 2019-11-02 ENCOUNTER — Encounter (HOSPITAL_COMMUNITY)
Admission: RE | Disposition: A | Discharge status: Home / Self Care | Payer: Self-pay | Source: Home / Self Care | Attending: Gastroenterology

## 2019-11-02 ENCOUNTER — Encounter (HOSPITAL_COMMUNITY): Payer: Self-pay | Admitting: Gastroenterology

## 2019-11-02 ENCOUNTER — Ambulatory Visit (HOSPITAL_COMMUNITY): Payer: Medicaid Other | Admitting: Anesthesiology

## 2019-11-02 ENCOUNTER — Ambulatory Visit (HOSPITAL_COMMUNITY)
Admission: RE | Admit: 2019-11-02 | Discharge: 2019-11-02 | Disposition: A | Discharge status: Home / Self Care | Payer: Medicaid Other | Attending: Gastroenterology | Admitting: Gastroenterology

## 2019-11-02 ENCOUNTER — Other Ambulatory Visit: Payer: Self-pay

## 2019-11-02 DIAGNOSIS — Z791 Long term (current) use of non-steroidal anti-inflammatories (NSAID): Secondary | ICD-10-CM | POA: Insufficient documentation

## 2019-11-02 DIAGNOSIS — K219 Gastro-esophageal reflux disease without esophagitis: Secondary | ICD-10-CM | POA: Insufficient documentation

## 2019-11-02 DIAGNOSIS — Z79899 Other long term (current) drug therapy: Secondary | ICD-10-CM | POA: Insufficient documentation

## 2019-11-02 DIAGNOSIS — K625 Hemorrhage of anus and rectum: Secondary | ICD-10-CM | POA: Insufficient documentation

## 2019-11-02 DIAGNOSIS — K648 Other hemorrhoids: Secondary | ICD-10-CM | POA: Insufficient documentation

## 2019-11-02 DIAGNOSIS — D125 Benign neoplasm of sigmoid colon: Secondary | ICD-10-CM

## 2019-11-02 DIAGNOSIS — K635 Polyp of colon: Secondary | ICD-10-CM | POA: Diagnosis not present

## 2019-11-02 DIAGNOSIS — F419 Anxiety disorder, unspecified: Secondary | ICD-10-CM | POA: Diagnosis not present

## 2019-11-02 DIAGNOSIS — I10 Essential (primary) hypertension: Secondary | ICD-10-CM | POA: Insufficient documentation

## 2019-11-02 DIAGNOSIS — J449 Chronic obstructive pulmonary disease, unspecified: Secondary | ICD-10-CM | POA: Diagnosis not present

## 2019-11-02 DIAGNOSIS — F17210 Nicotine dependence, cigarettes, uncomplicated: Secondary | ICD-10-CM | POA: Insufficient documentation

## 2019-11-02 DIAGNOSIS — F17290 Nicotine dependence, other tobacco product, uncomplicated: Secondary | ICD-10-CM | POA: Insufficient documentation

## 2019-11-02 HISTORY — PX: COLONOSCOPY WITH PROPOFOL: SHX5780

## 2019-11-02 HISTORY — PX: POLYPECTOMY: SHX5525

## 2019-11-02 HISTORY — PX: ESOPHAGOGASTRODUODENOSCOPY (EGD) WITH PROPOFOL: SHX5813

## 2019-11-02 SURGERY — ESOPHAGOGASTRODUODENOSCOPY (EGD) WITH PROPOFOL
Anesthesia: General

## 2019-11-02 MED ORDER — LIDOCAINE VISCOUS HCL 2 % MT SOLN: 15.0000 mL | Freq: Once | OROMUCOSAL | Status: AC

## 2019-11-02 MED ORDER — LACTATED RINGERS IV SOLN: INTRAVENOUS | Status: DC | PRN

## 2019-11-02 MED ORDER — STERILE WATER FOR IRRIGATION IR SOLN
Status: DC | PRN
  Administered 2019-11-02: 1.5 mL

## 2019-11-02 MED ORDER — LACTATED RINGERS IV SOLN: Freq: Once | INTRAVENOUS | Status: AC

## 2019-11-02 MED ORDER — LIDOCAINE VISCOUS HCL 2 % MT SOLN
OROMUCOSAL | Status: AC
  Administered 2019-11-02: 15 mL via OROMUCOSAL
  Filled 2019-11-02: qty 15

## 2019-11-02 MED ORDER — PROPOFOL 10 MG/ML IV BOLUS
INTRAVENOUS | Status: DC | PRN
  Administered 2019-11-02: 100 mg via INTRAVENOUS
  Administered 2019-11-02: 50 mg via INTRAVENOUS
  Administered 2019-11-02: 175 ug/kg/min via INTRAVENOUS

## 2019-11-02 MED ORDER — LIDOCAINE 2% (20 MG/ML) 5 ML SYRINGE
INTRAMUSCULAR | Status: AC
  Filled 2019-11-02: qty 30

## 2019-11-02 MED ORDER — LIDOCAINE HCL (CARDIAC) PF 50 MG/5ML IV SOSY
PREFILLED_SYRINGE | INTRAVENOUS | Status: DC | PRN
Start: 2019-11-02 — End: 2019-11-02
  Administered 2019-11-02: 100 mg via INTRAVENOUS

## 2019-11-02 MED ORDER — PROPOFOL 10 MG/ML IV BOLUS
INTRAVENOUS | Status: AC
  Filled 2019-11-02: qty 240

## 2019-11-02 MED ORDER — GLYCOPYRROLATE 0.2 MG/ML IJ SOLN
INTRAMUSCULAR | Status: AC
  Administered 2019-11-02: 0.2 mg via INTRAVENOUS
  Filled 2019-11-02: qty 1

## 2019-11-02 MED ORDER — GLYCOPYRROLATE 0.2 MG/ML IJ SOLN: 0.2000 mg | Freq: Once | INTRAMUSCULAR | Status: AC

## 2019-11-02 NOTE — H&P (Signed)
Rita Baldwin is an 34 y.o. female.   Chief Complaint: Heartburn and rectal bleeding. HPI: Rita Baldwin is a 34 y.o. 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  Patient reported having constipation as well as episode of rectal bleeding in the past, along with significant straining when having a bowel movement.  She has recently started taking MiraLAX with some improvement but no presence of more blood in her stool.  Also, she had recurrent episodes of GERD which were initially treated with famotidine and Protonix, but she had recurrence of her symptoms.  Most recently she was started on omeprazole which has completely resolved her symptoms.  Never had an EGD or colonoscopy.  Past Medical History:  Diagnosis Date  . Anxiety 2012  . Bronchitis   . Chlamydia 2009  . COPD (chronic obstructive pulmonary disease) ([place])   . Drug abuse ([place])   . GERD (gastroesophageal reflux disease) age 1  . Headache   . Hypertension 2015  . Overdose    "I overdosed on tylenol as a kid"  . Precancerous changes of the cervix as a child  . PTSD (post-traumatic stress disorder)   . Scoliosis fifth grade  . UTI (lower urinary tract infection)     Past Surgical History:  Procedure Laterality Date  . MULTIPLE TOOTH EXTRACTIONS      Family History  Problem Relation Age of Onset  . Cancer Mother        breast  . Hypertension Mother   . Asthma Father   . Diabetes Father   . Heart disease Father   . Hyperlipidemia Father   . Hypertension Father   . Cirrhosis Paternal Uncle   . Cancer Maternal Aunt        melanoma  . Diabetes Maternal Grandmother   . Diabetes Maternal Grandfather   . Diabetes Paternal Grandmother   . Diabetes Paternal Grandfather    Social History:  reports that she has been smoking cigarettes and e-cigarettes. She has a 4.50 pack-year smoking history.  She has never used smokeless tobacco. She reports current alcohol use. She reports that she does not use drugs.  Allergies:  Allergies  Allergen Reactions  . Morphine Hives    Medications Prior to Admission  Medication Sig Dispense Refill  . ALPRAZolam (XANAX) 0.25 MG tablet Take 0.25 mg by mouth 3 (three) times daily as needed for anxiety.     Marland Kitchen aspirin-acetaminophen-caffeine (EXCEDRIN MIGRAINE) 250-250-65 MG tablet Take 1 tablet by mouth every 8 (eight) hours as needed for headache.    . cloNIDine (CATAPRES) 0.1 MG tablet Take 0.1 mg by mouth at bedtime.     . cyclobenzaprine (FLEXERIL) 10 MG tablet Take 10 mg by mouth at bedtime as needed for muscle spasms.     . DULoxetine (CYMBALTA) 60 MG capsule Take 60 mg by mouth at bedtime.     . famotidine (PEPCID) 40 MG tablet Take 40-80 mg by mouth at bedtime.     . gabapentin (NEURONTIN) 600 MG tablet Take 600 mg by mouth 3 (three) times daily.    . hydrOXYzine (ATARAX/VISTARIL) 25 MG tablet Take 50 mg by mouth at bedtime.     . meloxicam (MOBIC) 15 MG tablet Take 15 mg by mouth daily.    Marland Kitchen omeprazole (PRILOSEC) 40 MG capsule Take 1 capsule (40 mg total) by mouth daily. 90 capsule  3  . ondansetron (ZOFRAN) 8 MG tablet Take 8 mg by mouth every 8 (eight) hours as needed for nausea or vomiting.     . polyethylene glycol (MIRALAX / GLYCOLAX) 17 g packet Take 17 g by mouth daily.    Marland Kitchen albuterol (PROVENTIL HFA;VENTOLIN HFA) 108 (90 BASE) MCG/ACT inhaler Inhale 2 puffs into the lungs every 6 (six) hours as needed for wheezing or shortness of breath. 3 Inhaler 1  . albuterol (PROVENTIL) (2.5 MG/3ML) 0.083% nebulizer solution Take 3 mLs (2.5 mg total) by nebulization every 6 (six) hours as needed for wheezing or shortness of breath. 25 vial 3    Results for orders placed or performed during the hospital encounter of 10/31/19 (from the past 48 hour(s))  Pregnancy, urine     Status: None   Collection Time: 10/31/19  2:42 PM  Result Value Ref Range    Preg Test, Ur NEGATIVE NEGATIVE    Comment:        THE SENSITIVITY OF THIS METHODOLOGY IS >20 mIU/mL. Performed at [facility], [address] [place], [place] [address]    No results found.  Review of Systems  Constitutional: Negative.   HENT: Negative.   Eyes: Negative.   Respiratory: Negative.   Cardiovascular: Negative.   Gastrointestinal: Positive for blood in stool.  Endocrine: Negative.   Genitourinary: Negative.   Musculoskeletal: Negative.   Skin: Negative.   Allergic/Immunologic: Negative.   Neurological: Negative.   Hematological: Negative.   Psychiatric/Behavioral: Negative.     Blood pressure (!) 119/93, pulse 69, temperature 98.2 F (36.8 C), temperature source Oral, resp. rate 17, height 5\' 3"  (1.6 m), weight 68 kg, SpO2 100 %. Physical Exam  GENERAL: The patient is AO x3, in no acute distress. HEENT: Head is normocephalic and atraumatic. EOMI are intact. Mouth is well hydrated and without lesions. Edentulous. NECK: Supple. No masses LUNGS: Clear to auscultation. No presence of rhonchi/wheezing/rales. Adequate chest expansion HEART: RRR, normal s1 and s2. ABDOMEN: Soft, nontender, no guarding, no peritoneal signs, and nondistended. BS +. No masses. EXTREMITIES: Without any cyanosis, clubbing, rash, lesions or edema. NEUROLOGIC: AOx3, no focal motor deficit. SKIN: no jaundice, no rashes  Assessment/Plan 35 y.o. 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  We will proceed with EGD and colonoscopy today.  Harvel Quale, MD 11/02/2019, 7:44 AM

## 2019-11-02 NOTE — Op Note (Signed)
[facility] Patient Name: Rita Baldwin Procedure Date: 11/02/2019 7:17 AM MRN: [id number] Date of Birth: [birth year] Attending MD: Maylon Peppers ,  CSN: [id number] Age: 34 Admit Type: Outpatient Procedure:                Upper GI endoscopy Indications:              Follow-up of gastro-esophageal reflux disease Providers:                Maylon Peppers, Jeanann Lewandowsky. Sharon Seller, RN, Caprice Kluver, Raphael Gibney, Technician Referring MD:              Medicines:                Monitored Anesthesia Care Complications:            No immediate complications. Estimated Blood Loss:     Estimated blood loss: none. Procedure:                Pre-Anesthesia Assessment:                           - Prior to the procedure, a History and Physical                            was performed, and patient medications, allergies                            and sensitivities were reviewed. The patient's                            tolerance of previous anesthesia was reviewed.                           - The risks and benefits of the procedure and the                            sedation options and risks were discussed with the                            patient. All questions were answered and informed                            consent was obtained.                           - ASA Grade Assessment: II - A patient with mild                            systemic disease.                           After obtaining informed consent, the endoscope was                            passed under direct vision.  Throughout the                            procedure, the patient's blood pressure, pulse, and                            oxygen saturations were monitored continuously. The                            GIF-[id number] ([id number]) was introduced through the                            mouth, and advanced to the second part of duodenum.                            The patient tolerated the procedure  well. The upper                            GI endoscopy was technically difficult and complex                            due to poor IV access, needed left EJ line. Scope In: 9:26:52 AM Scope Out: 9:30:32 AM Total Procedure Duration: 0 hours 3 minutes 40 seconds  Findings:      The Z-line was regular and was found 37 cm from the incisors.      The examined esophagus was normal.      The entire examined stomach was normal.      The examined duodenum was normal. Impression:               - Z-line regular, 37 cm from the incisors.                           - Normal esophagus.                           - Normal stomach.                           - Normal examined duodenum.                           - No specimens collected. Moderate Sedation:      Per Anesthesia Care Recommendation:           - Discharge patient to home (ambulatory).                           - Resume previous diet.                           - Continue present medications, including                            omeprazole 40 mg. Procedure Code(s):        --- Professional ---  43235, GC, Esophagogastroduodenoscopy, flexible,                            transoral; diagnostic, including collection of                            specimen(s) by brushing or washing, when performed                            (separate procedure) Diagnosis Code(s):        --- Professional ---                           K21.9, Gastro-esophageal reflux disease without                            esophagitis CPT copyright 2019 American Medical Association. All rights reserved. The codes documented in this report are preliminary and upon coder review may  be revised to meet current compliance requirements. Maylon Peppers, MD Maylon Peppers,  11/02/2019 9:34:56 AM This report has been signed electronically. Number of Addenda: 0

## 2019-11-02 NOTE — Anesthesia Procedure Notes (Signed)
Anesthesia Procedure Note Left external jugular vein iv was inserted with 20g angiocath

## 2019-11-02 NOTE — Anesthesia Preprocedure Evaluation (Addendum)
Anesthesia Evaluation  Patient identified by MRN, date of birth, ID band Patient awake    Reviewed: Allergy & Precautions, NPO status , Patient's Chart, lab work & pertinent test results  History of Anesthesia Complications Negative for: history of anesthetic complications  Airway Mallampati: II  TM Distance: >3 FB Neck ROM: Full    Dental  (+) Missing, Dental Advisory Given   Pulmonary COPD, Current Smoker and Patient abstained from smoking.,    Pulmonary exam normal breath sounds clear to auscultation       Cardiovascular Exercise Tolerance: Good hypertension, Pt. on medications Normal cardiovascular exam Rhythm:Regular Rate:Normal     Neuro/Psych  Headaches, PSYCHIATRIC DISORDERS Anxiety    GI/Hepatic GERD  Medicated and Controlled,(+)     substance abuse  alcohol use,   Endo/Other  negative endocrine ROS  Renal/GU negative Renal ROS     Musculoskeletal negative musculoskeletal ROS (+) narcotic dependent  Abdominal   Peds  Hematology negative hematology ROS (+)   Anesthesia Other Findings   Reproductive/Obstetrics negative OB ROS                            Anesthesia Physical Anesthesia Plan  ASA: III  Anesthesia Plan: General   Post-op Pain Management:    Induction: Intravenous  PONV Risk Score and Plan: TIVA  Airway Management Planned: Nasal Cannula, Natural Airway and Simple Face Mask  Additional Equipment:   Intra-op Plan:   Post-operative Plan:   Informed Consent: I have reviewed the patients History and Physical, chart, labs and discussed the procedure including the risks, benefits and alternatives for the proposed anesthesia with the patient or authorized representative who has indicated his/her understanding and acceptance.     Dental advisory given  Plan Discussed with: CRNA  Anesthesia Plan Comments:        Anesthesia Quick Evaluation

## 2019-11-02 NOTE — Transfer of Care (Signed)
Immediate Anesthesia Transfer of Care Note  Patient: Rita Baldwin  Procedure(s) Performed: ESOPHAGOGASTRODUODENOSCOPY (EGD) WITH PROPOFOL (N/A ) COLONOSCOPY WITH PROPOFOL (N/A ) POLYPECTOMY  Patient Location: Endoscopy Unit  Anesthesia Type:General  Level of Consciousness: awake, alert , oriented and patient cooperative  Airway & Oxygen Therapy: Patient Spontanous Breathing  Post-op Assessment: Report given to RN, Post -op Vital signs reviewed and stable and Patient moving all extremities  Post vital signs: Reviewed and stable  Last Vitals:  Vitals Value Taken Time  BP    Temp    Pulse    Resp    SpO2      Last Pain:  Vitals:   11/02/19 0921  TempSrc:   PainSc: 0-No pain      Patients Stated Pain Goal: 5 (76/16/07 3710)  Complications: No complications documented.

## 2019-11-02 NOTE — Discharge Instructions (Signed)
Upper Endoscopy, Adult, Care After This sheet gives you information about how to care for yourself after your procedure. Your health care provider may also give you more specific instructions. If you have problems or questions, contact your health care provider. What can I expect after the procedure? After the procedure, it is common to have:  A sore throat.  Mild stomach pain or discomfort.  Bloating.  Nausea. Follow these instructions at home:   Follow instructions from your health care provider about what to eat or drink after your procedure.  Return to your normal activities as told by your health care provider. Ask your health care provider what activities are safe for you.  Take over-the-counter and prescription medicines only as told by your health care provider.  Do not drive for 24 hours if you were given a sedative during your procedure.  Keep all follow-up visits as told by your health care provider. This is important. Contact a health care provider if you have:  A sore throat that lasts longer than one day.  Trouble swallowing. Get help right away if:  You vomit blood or your vomit looks like coffee grounds.  You have: ? A fever. ? Bloody, black, or tarry stools. ? A severe sore throat or you cannot swallow. ? Difficulty breathing. ? Severe pain in your chest or abdomen. Summary  After the procedure, it is common to have a sore throat, mild stomach discomfort, bloating, and nausea.  Do not drive for 24 hours if you were given a sedative during the procedure.  Follow instructions from your health care provider about what to eat or drink after your procedure.  Return to your normal activities as told by your health care provider. This information is not intended to replace advice given to you by your health care provider. Make sure you discuss any questions you have with your health care provider. Document Revised: 08/25/2017 Document Reviewed:  08/03/2017 Elsevier Patient Education  [place]. Colonoscopy, Adult, Care After This sheet gives you information about how to care for yourself after your procedure. Your doctor may also give you more specific instructions. If you have problems or questions, call your doctor. What can I expect after the procedure? After the procedure, it is common to have:  A small amount of blood in your poop (stool) for 24 hours.  Some gas.  Mild cramping or bloating in your belly (abdomen). Follow these instructions at home: Eating and drinking   Drink enough fluid to keep your pee (urine) pale yellow.  Follow instructions from your doctor about what you cannot eat or drink.  Return to your normal diet as told by your doctor. Avoid heavy or fried foods that are hard to digest. Activity  Rest as told by your doctor.  Do not sit for a long time without moving. Get up to take short walks every 1-2 hours. This is important. Ask for help if you feel weak or unsteady.  Return to your normal activities as told by your doctor. Ask your doctor what activities are safe for you. To help cramping and bloating:   Try walking around.  Put heat on your belly as told by your doctor. Use the heat source that your doctor recommends, such as a moist heat pack or a heating pad. ? Put a towel between your skin and the heat source. ? Leave the heat on for 20-30 minutes. ? Remove the heat if your skin turns bright red. This is very important  if you are unable to feel pain, heat, or cold. You may have a greater risk of getting burned. General instructions  For the first 24 hours after the procedure: ? Do not drive or use machinery. ? Do not sign important documents. ? Do not drink alcohol. ? Do your daily activities more slowly than normal. ? Eat foods that are soft and easy to digest.  Take over-the-counter or prescription medicines only as told by your doctor.  Keep all follow-up visits as told  by your doctor. This is important. Contact a doctor if:  You have blood in your poop 2-3 days after the procedure. Get help right away if:  You have more than a small amount of blood in your poop.  You see large clumps of tissue (blood clots) in your poop.  Your belly is swollen.  You feel like you may vomit (nauseous).  You vomit.  You have a fever.  You have belly pain that gets worse, and medicine does not help your pain. Summary  After the procedure, it is common to have a small amount of blood in your poop. You may also have mild cramping and bloating in your belly.  For the first 24 hours after the procedure, do not drive or use machinery, do not sign important documents, and do not drink alcohol.  Get help right away if you have a lot of blood in your poop, feel like you may vomit, have a fever, or have more belly pain. This information is not intended to replace advice given to you by your health care provider. Make sure you discuss any questions you have with your health care provider. Document Revised: 09/27/2018 Document Reviewed: 09/27/2018 Elsevier Patient Education  2020 [place] are being discharged to home.  Resume your previous diet.  Continue your present medications, including omeprazole 40 mg.  We are waiting for your pathology results.  Your physician has recommended a repeat colonoscopy for surveillance based on pathology results.

## 2019-11-02 NOTE — Anesthesia Postprocedure Evaluation (Signed)
Anesthesia Post Note  Patient: Rita Baldwin  Procedure(s) Performed: ESOPHAGOGASTRODUODENOSCOPY (EGD) WITH PROPOFOL (N/A ) COLONOSCOPY WITH PROPOFOL (N/A ) POLYPECTOMY  Patient location during evaluation: Endoscopy Anesthesia Type: General Level of consciousness: awake, oriented and awake and alert Pain management: pain level controlled Vital Signs Assessment: post-procedure vital signs reviewed and stable Respiratory status: spontaneous breathing, respiratory function stable, nonlabored ventilation and patient connected to nasal cannula oxygen Cardiovascular status: blood pressure returned to baseline and stable Postop Assessment: no headache and no backache Anesthetic complications: no   No complications documented.   Last Vitals:  Vitals:   11/02/19 0708  BP: (!) 119/93  Pulse: 69  Resp: 17  Temp: 36.8 C  SpO2: 100%    Last Pain:  Vitals:   11/02/19 0921  TempSrc:   PainSc: 0-No pain                 Tacy Learn

## 2019-11-02 NOTE — Op Note (Addendum)
[facility] Patient Name: Rita Baldwin Procedure Date: 11/02/2019 9:33 AM MRN: [id number] Date of Birth: 08/14/1985 Attending MD: Maylon Peppers ,  CSN: [id number] Age: 34 Admit Type: Outpatient Procedure:                Colonoscopy Indications:              Rectal bleeding Providers:                Maylon Peppers, [place] Sharon Seller, RN, Caprice Kluver, Raphael Gibney, Technician Referring MD:              Medicines:                Monitored Anesthesia Care Complications:            No immediate complications. Estimated Blood Loss:     Estimated blood loss: none. Procedure:                Pre-Anesthesia Assessment:                           - Prior to the procedure, a History and Physical                            was performed, and patient medications, allergies                            and sensitivities were reviewed. The patient's                            tolerance of previous anesthesia was reviewed.                           - The risks and benefits of the procedure and the                            sedation options and risks were discussed with the                            patient. All questions were answered and informed                            consent was obtained.                           - ASA Grade Assessment: II - A patient with mild                            systemic disease.                           After obtaining informed consent, the colonoscope                            was passed under direct vision. Throughout the  procedure, the patient's blood pressure, pulse, and                            oxygen saturations were monitored continuously. The                            PCF-H190DL ([id number]) scope was introduced through                            the anus and advanced to the the cecum, identified                            by appendiceal orifice and ileocecal valve. The                             colonoscopy was performed without difficulty. The                            patient tolerated the procedure well. The quality                            of the bowel preparation was fair. Scope withdrawal                            time was 12 minutes. Scope In: 9:38:22 AM Scope Out: 9:56:52 AM Scope Withdrawal Time: 0 hours 13 minutes 7 seconds  Total Procedure Duration: 0 hours 18 minutes 30 seconds  Findings:      The perianal and digital rectal examinations were normal.      A moderate amount of semi-liquid stool was found in the ascending colon       and in the cecum, interfering with visualization. Lavage of the area was       performed using a large amount, resulting in clearance with fair       visualization.      Two sessile polyps were found in the sigmoid colon and descending colon.       The polyps were 2 to 3 mm in size. These polyps were removed with a cold       biopsy forceps. Resection and retrieval were complete.      Non-bleeding internal hemorrhoids were found during retroflexion. The       hemorrhoids were moderate. Impression:               - Preparation of the colon was fair.                           - Stool in the ascending colon and in the cecum.                           - Two 2 to 3 mm polyps in the sigmoid colon and in                            the descending colon, removed with a cold biopsy  forceps. Resected and retrieved.                           - Non-bleeding internal hemorrhoids. Moderate Sedation:      Per Anesthesia Care Recommendation:           - Discharge patient to home (ambulatory).                           - Resume previous diet.                           - Await pathology results.                           - Repeat colonoscopy for surveillance based on                            pathology results. Procedure Code(s):        --- Professional ---                           [phone], GC, Colonoscopy, flexible;  with biopsy,                            single or multiple Diagnosis Code(s):        --- Professional ---                           K64.8, Other hemorrhoids                           K63.5, Polyp of colon                           K62.5, Hemorrhage of anus and rectum CPT copyright 2019 American Medical Association. All rights reserved. The codes documented in this report are preliminary and upon coder review may  be revised to meet current compliance requirements. Maylon Peppers, MD Maylon Peppers,  11/02/2019 10:02:53 AM This report has been signed electronically. Number of Addenda: 0

## 2019-11-03 LAB — SURGICAL PATHOLOGY

## 2019-11-04 ENCOUNTER — Encounter (HOSPITAL_COMMUNITY): Payer: Self-pay | Admitting: Gastroenterology

## 2022-06-10 ENCOUNTER — Encounter: Payer: Medicaid Other | Admitting: Obstetrics & Gynecology

## 2022-07-16 ENCOUNTER — Encounter: Payer: Self-pay | Admitting: Obstetrics & Gynecology

## 2022-07-16 ENCOUNTER — Ambulatory Visit (INDEPENDENT_AMBULATORY_CARE_PROVIDER_SITE_OTHER): Payer: Medicaid Other | Admitting: Obstetrics & Gynecology

## 2022-07-16 VITALS — BP 106/77 | HR 98 | Wt 125.0 lb

## 2022-07-16 DIAGNOSIS — N939 Abnormal uterine and vaginal bleeding, unspecified: Secondary | ICD-10-CM

## 2022-07-16 DIAGNOSIS — F17218 Nicotine dependence, cigarettes, with other nicotine-induced disorders: Secondary | ICD-10-CM | POA: Diagnosis not present

## 2022-07-16 DIAGNOSIS — Z9851 Tubal ligation status: Secondary | ICD-10-CM | POA: Diagnosis not present

## 2022-07-16 DIAGNOSIS — Z98891 History of uterine scar from previous surgery: Secondary | ICD-10-CM | POA: Diagnosis not present

## 2022-07-16 DIAGNOSIS — B009 Herpesviral infection, unspecified: Secondary | ICD-10-CM

## 2022-07-16 MED ORDER — NORETHINDRONE 0.35 MG PO TABS: 1.0000 | ORAL_TABLET | Freq: Every day | ORAL | 4 refills | Status: DC

## 2022-07-16 NOTE — Progress Notes (Signed)
   GYN VISIT Patient name: Rita Baldwin MRN [id number]  Date of birth: [birth year] Chief Complaint:   Menorrhagia (Periods bad since having last baby. )  History of Present Illness:   Rita Baldwin is a 37 y.o. [id number] female being seen today for the following concerns:     Menses are every month- typically 3- 7 day period then 5-8 days later spot for an additional week.  Day 2-4 changing a pad every hour.  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  Previously taking medication as needed  C-section x 2 Notes h/o HSV- previously on treatment prn; however notes outbreak with every period  Notes stress incontinence, +urgency, denies nocturia.  Not really an issue for her.  She reports drinking lots of tea and coffee  Some constipation, no diarrhea Decreased appetite and low energy  No LMP recorded.  Contraception: BTL     07/16/2022   10:52 AM  Depression screen PHQ 2/9  Decreased Interest 1  Down, Depressed, Hopeless 1  PHQ - 2 Score 2  Altered sleeping 3  Tired, decreased energy 3  Change in appetite 3  Feeling bad or failure about yourself  2  Trouble concentrating 3  Moving slowly or fidgety/restless 1  Suicidal thoughts 0  PHQ-9 Score 17     Review of Systems:   Pertinent items are noted in HPI Denies fever/chills, dizziness, headaches, visual disturbances, fatigue, shortness of breath, chest pain, abdominal pain, vomiting. Pertinent History Reviewed:  Reviewed past medical,surgical, social, obstetrical and family history.  Reviewed problem list, medications and allergies. Physical Assessment:   Vitals:   07/16/22 1053  BP: 106/77  Pulse: 98  Weight: 125 lb (56.7 kg)  Body mass index is 22.14 kg/m.       Physical Examination:   General appearance: alert, well appearing, and in no distress  Psych: mood appropriate, normal affect  Skin: warm & dry   Cardiovascular: normal heart rate noted  Respiratory:  normal respiratory effort, no distress  Abdomen: soft, non-tender no reproducible pain, no rebound no guarding  Pelvic: examination not indicated  Extremities: no edema   Chaperone: N/A    Assessment & Plan:  1) AUB -Discussed potential causes such as perimenopausal status, tubal ligation -Will plan for pelvic ultrasound to rule out underlying etiology -Due to tobacco use patient is not a candidate for combined estrogen therapy -Discussed management options including medical management such as pill, Depo, IUD -Discussed surgical intervention such as endometrial ablation or hysterectomy -Reviewed pros cons of each option, after much discussion patient wishes for trial of pops -Will plan for pill to help ease symptoms until [place] and surgery can be scheduled  2) HSV2 -Plan to start suppression therapy  Meds ordered this encounter  Medications   norethindrone (MICRONOR) 0.35 MG tablet    Sig: Take 1 tablet (0.35 mg total) by mouth daily.    Dispense:  90 tablet    Refill:  4   valACYclovir (VALTREX) 1000 MG tablet    Sig: Take 1 tablet (1,000 mg total) by mouth daily.    Dispense:  90 tablet    Refill:  4     Myna Hidalgo, DO Attending Obstetrician & Gynecologist, [facility] for [employer], [facility]

## 2022-07-17 MED ORDER — VALACYCLOVIR HCL 1 G PO TABS: 1000.0000 mg | ORAL_TABLET | Freq: Every day | ORAL | 4 refills | Status: AC

## 2022-07-17 NOTE — Addendum Note (Signed)
Addended by: Sharon Seller on: 07/17/2022 08:22 AM   Modules accepted: Orders

## 2022-08-06 ENCOUNTER — Encounter: Payer: Self-pay | Admitting: Obstetrics & Gynecology

## 2022-08-20 ENCOUNTER — Ambulatory Visit: Payer: Medicaid Other | Admitting: Obstetrics & Gynecology

## 2022-08-20 ENCOUNTER — Other Ambulatory Visit: Payer: Medicaid Other

## 2022-08-25 ENCOUNTER — Ambulatory Visit (HOSPITAL_COMMUNITY)
Admission: RE | Admit: 2022-08-25 | Discharge: 2022-08-25 | Disposition: A | Discharge status: Home / Self Care | Payer: Medicaid Other | Source: Ambulatory Visit | Attending: Obstetrics & Gynecology | Admitting: Obstetrics & Gynecology

## 2022-08-25 DIAGNOSIS — N939 Abnormal uterine and vaginal bleeding, unspecified: Secondary | ICD-10-CM | POA: Diagnosis present

## 2022-08-27 ENCOUNTER — Encounter: Payer: Self-pay | Admitting: Obstetrics & Gynecology

## 2022-08-27 ENCOUNTER — Ambulatory Visit (INDEPENDENT_AMBULATORY_CARE_PROVIDER_SITE_OTHER): Payer: Medicaid Other | Admitting: Obstetrics & Gynecology

## 2022-08-27 VITALS — BP 124/85 | HR 84 | Wt 123.2 lb

## 2022-08-27 DIAGNOSIS — N920 Excessive and frequent menstruation with regular cycle: Secondary | ICD-10-CM | POA: Diagnosis not present

## 2022-08-27 NOTE — Progress Notes (Signed)
GYN VISIT Patient name: Rita Baldwin MRN [id number]  Date of birth: [birth year] Chief Complaint:   Follow-up  History of Present Illness:   Rita Baldwin is a 37 y.o. [id number] female being seen today for the following concerns:  HMB: Currently on POPs and so far has not seen much change.  In review, menses are every month- typically 3- 7 day period then 5-8 days later spot for an additional week.  Day 2-4 changing a pad every hour.  Notes considerable cramping   [place] completed 07/2022: 7.4 x 4.4 x 6.0 cm = volume: 102 mL. No fibroids or other mass visualized.  Normal ovaries bilaterally  No LMP recorded. (Menstrual status: Irregular Periods).  Previously noted HSV recurrence, doing better on suppression therapy.  Review of Systems:   Pertinent items are noted in HPI Denies fever/chills, dizziness, headaches, visual disturbances, fatigue, shortness of breath, chest pain, abdominal pain, vomiting, no problems with bowel movements, urination, or intercourse unless otherwise stated above.  Pertinent History Reviewed:   Past Surgical History:  Procedure Laterality Date   CESAREAN SECTION     C/S x 2   COLONOSCOPY WITH PROPOFOL N/A 11/02/2019   Procedure: COLONOSCOPY WITH PROPOFOL;  Surgeon: Dolores Frame, MD;  [facility];  Service: Gastroenterology;  Laterality: N/A;   ESOPHAGOGASTRODUODENOSCOPY (EGD) WITH PROPOFOL N/A 11/02/2019   Procedure: ESOPHAGOGASTRODUODENOSCOPY (EGD) WITH PROPOFOL;  Surgeon: Dolores Frame, MD;  Location: [facility];  Service: Gastroenterology;  Laterality: N/A;  730   MULTIPLE TOOTH EXTRACTIONS     POLYPECTOMY  11/02/2019   Procedure: POLYPECTOMY;  Surgeon: Marguerita Merles, Reuel Boom, MD;  Location: [facility];  Service: Gastroenterology;;   TUBAL LIGATION      Past Medical History:  Diagnosis Date   Anxiety 2012   Bronchitis    Chlamydia 2009   COPD (chronic obstructive pulmonary disease) (HCC)    Drug  abuse (HCC)    GERD (gastroesophageal reflux disease) age 36   Headache    Hypertension 2015   Overdose    "I overdosed on tylenol as a kid"   Precancerous changes of the cervix as a child   PTSD (post-traumatic stress disorder)    Scoliosis fifth grade   UTI (lower urinary tract infection)    Reviewed problem list, medications and allergies. Physical Assessment:   Vitals:   08/27/22 0841  BP: 124/85  Pulse: 84  Weight: 123 lb 3.2 oz (55.9 kg)  Body mass index is 21.82 kg/m.       Physical Examination:   General appearance: alert, well appearing, and in no distress  Psych: mood appropriate, normal affect  Skin: warm & dry   Cardiovascular: normal heart rate noted  Respiratory: normal respiratory effort, no distress  Abdomen: soft, non-tender   Pelvic: VULVA: normal appearing vulva with no masses, tenderness or lesions, VAGINA: normal appearing vagina with normal color and discharge, no lesions, CERVIX: normal appearing cervix without discharge or lesions, UTERUS: uterus is normal size, shape, consistency and nontender, ADNEXA: normal adnexa in size, nontender and no masses  Extremities: no edema   Chaperone: Latisha Cresenzo    Assessment & Plan:  1) HMB Reviewed options- including LARCs, endometrial ablation or hysterectomy -desires endometrial ablation -Reviewed risk and benefits including but not limited to risk of infection, bleeding, injury or failure of procedure.  Discussed that 90% of women noted improvement of there.,  Which means 10% seek other treatment -Discussed same-day procedure and reviewed postop recovery -Questions and  concerns were addressed patient desires to proceed at next available -Encourage patient to continue on POPs until surgical date -surgical referral created    Myna Hidalgo, DO Attending Obstetrician & Gynecologist, [facility] for [facility], [facility]

## 2022-09-01 ENCOUNTER — Encounter: Payer: Self-pay | Admitting: Obstetrics & Gynecology

## 2022-10-09 NOTE — Patient Instructions (Signed)
Rita Baldwin  10/09/2022     @PREFPERIOPPHARMACY @   Your procedure is scheduled on  10/14/2022.   Report to [facility] at  0600  A.M.   Call this number if you have problems the morning of surgery:  [phone]  If you experience any cold or flu symptoms such as cough, fever, chills, shortness of breath, etc. between now and your scheduled surgery, please notify us at the above number.   Remember:  Do not eat or drink after midnight.         Use your nebulizer and your inhaler before you come and bring your rescue inhaler with you.     Take these medicines the morning of surgery with A SIP OF WATER                               celexa, clonazepam, pepcid.    Do not wear jewelry, make-up or nail polish, including gel polish,  artificial nails, or any other type of covering on natural nails (fingers and  toes).  Do not wear lotions, powders, or perfumes, or deodorant.  Do not shave 48 hours prior to surgery.  Men may shave face and neck.  Do not bring valuables to the hospital.  [facility] is not responsible for any belongings or valuables.  Contacts, dentures or bridgework may not be worn into surgery.  Leave your suitcase in the car.  After surgery it may be brought to your room.  For patients admitted to the hospital, discharge time will be determined by your treatment team.  Patients discharged the day of surgery will not be allowed to drive home and must have someone with them for 24 hours.    Special instructions:   DO NOT smoke tobacco or vape for 24 hours before your procedure.  Please read over the following fact sheets that you were given. Coughing and Deep Breathing, Surgical Site Infection Prevention, Anesthesia Post-op Instructions, and Care and Recovery After Surgery      Dilation and Curettage or Vacuum Curettage, Care After The following information offers guidance on how to care for yourself after your procedure. Your doctor may  also give you more specific instructions. If you have problems or questions, contact your doctor. What can I expect after the procedure? After the procedure, it is common to have: Mild pain or cramps. Some bleeding or spotting from the vagina. These may last for up to 2 weeks. Follow these instructions at home: Medicines Take over-the-counter and prescription medicines only as told by your doctor. If told, take steps to prevent problems with pooping (constipation). You may need to: Drink enough fluid to keep your pee (urine) pale yellow. Take medicines. You will be told what medicines to take. Eat foods that are high in fiber. These include beans, whole grains, and fresh fruits and vegetables. Limit foods that are high in fat and sugar. These include fried or sweet foods. Ask your doctor if you should avoid driving or using machines while you are taking your medicine. Activity  If you were given a medicine to help you relax (sedative) during your procedure, it can affect you for many hours. Do not drive or use machinery until your doctor says that it is safe. Rest as told by your doctor. Get up to take short walks every 1-2 hours. Ask for help if you feel weak or unsteady. Do  not lift anything that is heavier than 10 lb (4.5 kg), or the limit that you are told. Return to your normal activities when your doctor says that it is safe. Lifestyle For at least 2 weeks, or as long as told by your doctor: Do not douche. Do not use tampons. Do not have sex. General instructions Do not take baths, swim, or use a hot tub. Ask your doctor if you may take showers. Do not smoke or use any products that contain nicotine or tobacco. These can delay healing. If you need help quitting, ask your doctor. Wear compression stockings as told by your doctor. It is up to you to get the results of your procedure. Ask how to get your results when they are ready. Keep all follow-up visits. Contact a doctor  if: You have very bad cramps that get worse or do not get better with medicine. You have very bad pain in your belly (abdomen). You cannot drink fluids without vomiting. You have pain in the area just above your thighs. You have fluid from your vagina that smells bad. You have a rash. Get help right away if: You are bleeding a lot from your vagina. This means soaking more than one sanitary pad in 1 hour, and this happens for 2 hours in a row. You have a fever that is above 100.40F (38C). Your belly feels very tender or hard. You have chest pain. You have trouble breathing. You feel dizzy or light-headed. You faint. You have pain in your neck or shoulder area. These symptoms may be an emergency. Get help right away. Call your local emergency services (911 in the [place] Do not wait to see if the symptoms will go away. Do not drive yourself to the hospital. Summary After your procedure, it is common to have pain or cramping. It is also common to have bleeding or spotting from your vagina. Rest as told. Get up to take short walks every 1-2 hours. Do not lift anything that is heavier than 10 lb (4.5 kg), or the limit that you are told. Get help right away if you have problems from the procedure. Ask your doctor what problems to watch for. This information is not intended to replace advice given to you by your health care provider. Make sure you discuss any questions you have with your health care provider. Document Revised: 02/22/2020 Document Reviewed: 02/22/2020 Elsevier Patient Education  2024 [employer] General Anesthesia, Adult, Care After The following information offers guidance on how to care for yourself after your procedure. Your health care provider may also give you more specific instructions. If you have problems or questions, contact your health care provider. What can I expect after the procedure? After the procedure, it is common for people to: Have pain or discomfort  at the IV site. Have nausea or vomiting. Have a sore throat or hoarseness. Have trouble concentrating. Feel cold or chills. Feel weak, sleepy, or tired (fatigue). Have soreness and body aches. These can affect parts of the body that were not involved in surgery. Follow these instructions at home: For the time period you were told by your health care provider:  Rest. Do not participate in activities where you could fall or become injured. Do not drive or use machinery. Do not drink alcohol. Do not take sleeping pills or medicines that cause drowsiness. Do not make important decisions or sign legal documents. Do not take care of children on your own. General instructions Drink enough fluid to  keep your urine pale yellow. If you have sleep apnea, surgery and certain medicines can increase your risk for breathing problems. Follow instructions from your health care provider about wearing your sleep device: Anytime you are sleeping, including during daytime naps. While taking prescription pain medicines, sleeping medicines, or medicines that make you drowsy. Return to your normal activities as told by your health care provider. Ask your health care provider what activities are safe for you. Take over-the-counter and prescription medicines only as told by your health care provider. Do not use any products that contain nicotine or tobacco. These products include cigarettes, chewing tobacco, and vaping devices, such as e-cigarettes. These can delay incision healing after surgery. If you need help quitting, ask your health care provider. Contact a health care provider if: You have nausea or vomiting that does not get better with medicine. You vomit every time you eat or drink. You have pain that does not get better with medicine. You cannot urinate or have bloody urine. You develop a skin rash. You have a fever. Get help right away if: You have trouble breathing. You have chest pain. You  vomit blood. These symptoms may be an emergency. Get help right away. Call 911. Do not wait to see if the symptoms will go away. Do not drive yourself to the hospital. Summary After the procedure, it is common to have a sore throat, hoarseness, nausea, vomiting, or to feel weak, sleepy, or fatigue. For the time period you were told by your health care provider, do not drive or use machinery. Get help right away if you have difficulty breathing, have chest pain, or vomit blood. These symptoms may be an emergency. This information is not intended to replace advice given to you by your health care provider. Make sure you discuss any questions you have with your health care provider. Document Revised: 05/31/2021 Document Reviewed: 05/31/2021 Elsevier Patient Education  2024 Else[employer] Inc. How to Use Chlorhexidine Before Surgery Chlorhexidine gluconate (CHG) is a germ-killing (antiseptic) solution that is used to clean the skin. It can get rid of the bacteria that normally live on the skin and can keep them away for about 24 hours. To clean your skin with CHG, you may be given: A CHG solution to use in the shower or as part of a sponge bath. A prepackaged cloth that contains CHG. Cleaning your skin with CHG may help lower the risk for infection: While you are staying in the intensive care unit of the hospital. If you have a vascular access, such as a central line, to provide short-term or long-term access to your veins. If you have a catheter to drain urine from your bladder. If you are on a ventilator. A ventilator is a machine that helps you breathe by moving air in and out of your lungs. After surgery. What are the risks? Risks of using CHG include: A skin reaction. Hearing loss, if CHG gets in your ears and you have a perforated eardrum. Eye injury, if CHG gets in your eyes and is not rinsed out. The CHG product catching fire. Make sure that you avoid smoking and flames after applying CHG  to your skin. Do not use CHG: If you have a chlorhexidine allergy or have previously reacted to chlorhexidine. On babies younger than 63 months of age. How to use CHG solution Use CHG only as told by your health care provider, and follow the instructions on the label. Use the full amount of CHG as directed. Usually,  this is one bottle. During a shower Follow these steps when using CHG solution during a shower (unless your health care provider gives you different instructions): Start the shower. Use your normal soap and shampoo to wash your face and hair. Turn off the shower or move out of the shower stream. Pour the CHG onto a clean washcloth. Do not use any type of brush or rough-edged sponge. Starting at your neck, lather your body down to your toes. Make sure you follow these instructions: If you will be having surgery, pay special attention to the part of your body where you will be having surgery. Scrub this area for at least 1 minute. Do not use CHG on your head or face. If the solution gets into your ears or eyes, rinse them well with water. Avoid your genital area. Avoid any areas of skin that have broken skin, cuts, or scrapes. Scrub your back and under your arms. Make sure to wash skin folds. Let the lather sit on your skin for 1-2 minutes or as long as told by your health care provider. Thoroughly rinse your entire body in the shower. Make sure that all body creases and crevices are rinsed well. Dry off with a clean towel. Do not put any substances on your body afterward--such as powder, lotion, or perfume--unless you are told to do so by your health care provider. Only use lotions that are recommended by the manufacturer. Put on clean clothes or pajamas. If it is the night before your surgery, sleep in clean sheets.  During a sponge bath Follow these steps when using CHG solution during a sponge bath (unless your health care provider gives you different instructions): Use your  normal soap and shampoo to wash your face and hair. Pour the CHG onto a clean washcloth. Starting at your neck, lather your body down to your toes. Make sure you follow these instructions: If you will be having surgery, pay special attention to the part of your body where you will be having surgery. Scrub this area for at least 1 minute. Do not use CHG on your head or face. If the solution gets into your ears or eyes, rinse them well with water. Avoid your genital area. Avoid any areas of skin that have broken skin, cuts, or scrapes. Scrub your back and under your arms. Make sure to wash skin folds. Let the lather sit on your skin for 1-2 minutes or as long as told by your health care provider. Using a different clean, wet washcloth, thoroughly rinse your entire body. Make sure that all body creases and crevices are rinsed well. Dry off with a clean towel. Do not put any substances on your body afterward--such as powder, lotion, or perfume--unless you are told to do so by your health care provider. Only use lotions that are recommended by the manufacturer. Put on clean clothes or pajamas. If it is the night before your surgery, sleep in clean sheets. How to use CHG prepackaged cloths Only use CHG cloths as told by your health care provider, and follow the instructions on the label. Use the CHG cloth on clean, dry skin. Do not use the CHG cloth on your head or face unless your health care provider tells you to. When washing with the CHG cloth: Avoid your genital area. Avoid any areas of skin that have broken skin, cuts, or scrapes. Before surgery Follow these steps when using a CHG cloth to clean before surgery (unless your health care provider gives  you different instructions): Using the CHG cloth, vigorously scrub the part of your body where you will be having surgery. Scrub using a back-and-forth motion for 3 minutes. The area on your body should be completely wet with CHG when you are done  scrubbing. Do not rinse. Discard the cloth and let the area air-dry. Do not put any substances on the area afterward, such as powder, lotion, or perfume. Put on clean clothes or pajamas. If it is the night before your surgery, sleep in clean sheets.  For general bathing Follow these steps when using CHG cloths for general bathing (unless your health care provider gives you different instructions). Use a separate CHG cloth for each area of your body. Make sure you wash between any folds of skin and between your fingers and toes. Wash your body in the following order, switching to a new cloth after each step: The front of your neck, shoulders, and chest. Both of your arms, under your arms, and your hands. Your stomach and groin area, avoiding the genitals. Your right leg and foot. Your left leg and foot. The back of your neck, your back, and your buttocks. Do not rinse. Discard the cloth and let the area air-dry. Do not put any substances on your body afterward--such as powder, lotion, or perfume--unless you are told to do so by your health care provider. Only use lotions that are recommended by the manufacturer. Put on clean clothes or pajamas. Contact a health care provider if: Your skin gets irritated after scrubbing. You have questions about using your solution or cloth. You swallow any chlorhexidine. Call your local poison control center [phone]) [phone] in the [place] Get help right away if: Your eyes itch badly, or they become very red or swollen. Your skin itches badly and is red or swollen. Your hearing changes. You have trouble seeing. You have swelling or tingling in your mouth or throat. You have trouble breathing. These symptoms may represent a serious problem that is an emergency. Do not wait to see if the symptoms will go away. Get medical help right away. Call your local emergency services (911 in the [place] Do not drive yourself to the hospital. Summary Chlorhexidine  gluconate (CHG) is a germ-killing (antiseptic) solution that is used to clean the skin. Cleaning your skin with CHG may help to lower your risk for infection. You may be given CHG to use for bathing. It may be in a bottle or in a prepackaged cloth to use on your skin. Carefully follow your health care provider's instructions and the instructions on the product label. Do not use CHG if you have a chlorhexidine allergy. Contact your health care provider if your skin gets irritated after scrubbing. This information is not intended to replace advice given to you by your health care provider. Make sure you discuss any questions you have with your health care provider. Document Revised: 07/01/2021 Document Reviewed: 05/14/2020 Elsevier Patient Education  2023 [employer].

## 2022-10-10 ENCOUNTER — Encounter (HOSPITAL_COMMUNITY)
Admission: RE | Admit: 2022-10-10 | Discharge: 2022-10-10 | Disposition: A | Discharge status: Home / Self Care | Payer: Medicaid Other | Source: Ambulatory Visit | Attending: Obstetrics & Gynecology | Admitting: Obstetrics & Gynecology

## 2022-10-10 ENCOUNTER — Encounter (HOSPITAL_COMMUNITY): Payer: Self-pay

## 2022-10-10 VITALS — BP 108/69 | HR 70 | Temp 98.6°F | Resp 16 | Ht 62.0 in | Wt 120.0 lb

## 2022-10-10 DIAGNOSIS — J449 Chronic obstructive pulmonary disease, unspecified: Secondary | ICD-10-CM | POA: Insufficient documentation

## 2022-10-10 DIAGNOSIS — Z01818 Encounter for other preprocedural examination: Secondary | ICD-10-CM | POA: Diagnosis present

## 2022-10-10 DIAGNOSIS — N920 Excessive and frequent menstruation with regular cycle: Secondary | ICD-10-CM | POA: Insufficient documentation

## 2022-10-10 DIAGNOSIS — F17218 Nicotine dependence, cigarettes, with other nicotine-induced disorders: Secondary | ICD-10-CM | POA: Insufficient documentation

## 2022-10-10 HISTORY — DX: Unspecified osteoarthritis, unspecified site: M19.90

## 2022-10-10 HISTORY — DX: Palpitations: R00.2

## 2022-10-10 HISTORY — DX: Panic disorder (episodic paroxysmal anxiety): F41.0

## 2022-10-10 LAB — CBC
HCT: 39.3 % (ref 36.0–46.0)
Hemoglobin: 12.8 g/dL (ref 12.0–15.0)
MCH: 29.1 pg (ref 26.0–34.0)
MCHC: 32.6 g/dL (ref 30.0–36.0)
MCV: 89.3 fL (ref 80.0–100.0)
Platelets: 324 10*3/uL (ref 150–400)
RBC: 4.4 MIL/uL (ref 3.87–5.11)
RDW: 14.7 % (ref 11.5–15.5)
WBC: 6 10*3/uL (ref 4.0–10.5)
nRBC: 0 % (ref 0.0–0.2)

## 2022-10-10 LAB — PREGNANCY, URINE: Preg Test, Ur: NEGATIVE

## 2022-10-11 NOTE — H&P (Signed)
[facility] Attending History and Physical  Rita Baldwin is a 37 y.o. [id number] at Unknown who presents for scheduled Hysteroscopy, D&C, Hydrothermal ablation due to heavy menstrual bleeding.  In review, This has been an ongoing issue for over a year.  Menses are every month- typically 3- 7 day of bleeding then 5-8 days later spot for an additional week.  Day 2-4 changing a pad every hour.  Tried POPs with minimal improvement.  +dysmenorrhea  She has completed childbearing- s/p tubal ligation and desires long term management. ***  Denies any abnormal vaginal discharge, fevers, chills, sweats, dysuria, nausea, vomiting, other GI or GU symptoms or other general symptoms.  Past Medical History:  Diagnosis Date   Anxiety 03/17/2010   Arthritis    Bronchitis    Chlamydia 03/18/2007   COPD (chronic obstructive pulmonary disease) (HCC)    Drug abuse (HCC)    GERD (gastroesophageal reflux disease) age 29   Headache    Hypertension 03/17/2013   Overdose    "I overdosed on tylenol as a kid"   Palpitations    Panic attacks    Precancerous changes of the cervix as a child   PTSD (post-traumatic stress disorder)    Scoliosis fifth grade   UTI (lower urinary tract infection)    Past Surgical History:  Procedure Laterality Date   CESAREAN SECTION     C/S x 2   COLONOSCOPY WITH PROPOFOL N/A 11/02/2019   Procedure: COLONOSCOPY WITH PROPOFOL;  Surgeon: Dolores Frame, MD;  Location: [facility];  Service: Gastroenterology;  Laterality: N/A;   ESOPHAGOGASTRODUODENOSCOPY (EGD) WITH PROPOFOL N/A 11/02/2019   Procedure: ESOPHAGOGASTRODUODENOSCOPY (EGD) WITH PROPOFOL;  Surgeon: Dolores Frame, MD;  Location: [facility];  Service: Gastroenterology;  Laterality: N/A;  730   MULTIPLE TOOTH EXTRACTIONS     POLYPECTOMY  11/02/2019   Procedure: POLYPECTOMY;  Surgeon: Marguerita Merles, Reuel Boom, MD;  Location: [facility];  Service:  Gastroenterology;;   TUBAL LIGATION     OB History  Gravida Para Term Preterm AB Living  8 2 2   6 2   SAB IAB Ectopic Multiple Live Births  6       2    # Outcome Date GA Lbr Len/2nd Weight Sex Type Anes PTL Lv  8 SAB           7 SAB           6 SAB           5 SAB           4 SAB           3 SAB           2 Term      CS-LTranv   LIV  1 Term      CS-LTranv   LIV  Patient denies any other pertinent gynecologic issues.  No current facility-administered medications on file prior to encounter.   Current Outpatient Medications on File Prior to Encounter  Medication Sig Dispense Refill   albuterol (PROVENTIL HFA;VENTOLIN HFA) 108 (90 BASE) MCG/ACT inhaler Inhale 2 puffs into the lungs every 6 (six) hours as needed for wheezing or shortness of breath. 3 Inhaler 1   albuterol (PROVENTIL) (2.5 MG/3ML) 0.083% nebulizer solution Take 3 mLs (2.5 mg total) by nebulization every 6 (six) hours as needed for wheezing or shortness of breath. 25 vial 3   citalopram (CELEXA) 40 MG tablet Take 40 mg by mouth daily.  clonazePAM (KLONOPIN) 0.5 MG tablet Take 0.25-0.5 mg by mouth 3 (three) times daily as needed for anxiety.     famotidine (PEPCID) 40 MG tablet Take 40-80 mg by mouth daily with supper.     hydrOXYzine (ATARAX/VISTARIL) 25 MG tablet Take 25 mg by mouth at bedtime as needed (sleep).     ibuprofen (ADVIL) 800 MG tablet Take 800 mg by mouth 3 (three) times daily as needed for moderate pain.     ondansetron (ZOFRAN) 8 MG tablet Take 4-8 mg by mouth 3 (three) times daily as needed for nausea or vomiting.     Allergies  Allergen Reactions   Morphine Hives    Social History:   reports that she has been smoking cigarettes and e-cigarettes. She has a 4.5 pack-year smoking history. She has never used smokeless tobacco. She reports current alcohol use. She reports that she does not use drugs. Family History  Problem Relation Age of Onset   Cancer Mother        breast   Hypertension Mother     Asthma Father    Diabetes Father    Heart disease Father    Hyperlipidemia Father    Hypertension Father    Cirrhosis Paternal Uncle    Cancer Maternal Aunt        melanoma   Diabetes Maternal Grandmother    Diabetes Maternal Grandfather    Diabetes Paternal Grandmother    Diabetes Paternal Grandfather     Review of Systems: Pertinent items noted in HPI and remainder of comprehensive ROS otherwise negative.  PHYSICAL EXAM: There were no vitals taken for this visit. CONSTITUTIONAL: Well-developed, well-nourished female in no acute distress.  SKIN: Skin is warm and dry. No rash noted. Not diaphoretic. No erythema. No pallor. NEUROLOGIC: Alert and oriented to person, place, and time. Normal reflexes, muscle tone coordination. No cranial nerve deficit noted. PSYCHIATRIC: Normal mood and affect. Normal behavior. Normal judgment and thought content. CARDIOVASCULAR: Normal heart rate noted, regular rhythm RESPIRATORY: Effort and breath sounds normal, no problems with respiration noted ABDOMEN: Soft, nontender, nondistended. PELVIC: deferred MUSCULOSKELETAL: no calf tenderness bilaterally EXT: no edema bilaterally, normal pulses  Labs: Results for orders placed or performed during the hospital encounter of 10/10/22 (from the past 336 hour(s))  CBC   Collection Time: 10/10/22 12:27 PM  Result Value Ref Range   WBC 6.0 4.0 - 10.5 K/uL   RBC 4.40 3.87 - 5.11 MIL/uL   Hemoglobin 12.8 12.0 - 15.0 g/dL   HCT 52.8 41.3 - 24.4 %   MCV 89.3 80.0 - 100.0 fL   MCH 29.1 26.0 - 34.0 pg   MCHC 32.6 30.0 - 36.0 g/dL   RDW 01.0 27.2 - 53.6 %   Platelets 324 150 - 400 K/uL   nRBC 0.0 0.0 - 0.2 %  Pregnancy, urine   Collection Time: 10/10/22 12:27 PM  Result Value Ref Range   Preg Test, Ur NEGATIVE NEGATIVE    Imaging Studies: [place]: 08/25/2022: 7.4 x 4.4 x 6.0 cm = volume: 102 mL. No fibroids or other mass visualized.  Normal ovaries bilaterally  Assessment: Heavy menstrual  bleeding  Plan: Hysteroscopy, D&C, Hydrothermal ablation -NPO -LR @ 125cc/hr -SCDs to OR -Risk/benefits and alternatives reviewed with the patient including but not limited to risk of bleeding, infection and injury to surrounding organs.  Questions and concerns were addressed and pt desires to proceed  Myna Hidalgo, DO Attending Obstetrician & Gynecologist, [facility] for [facility], [facility] - [facility]

## 2022-10-14 ENCOUNTER — Ambulatory Visit (HOSPITAL_COMMUNITY): Payer: Medicaid Other | Admitting: Anesthesiology

## 2022-10-14 ENCOUNTER — Other Ambulatory Visit: Payer: Self-pay

## 2022-10-14 ENCOUNTER — Emergency Department (HOSPITAL_COMMUNITY)
Admission: EM | Admit: 2022-10-14 | Discharge: 2022-10-14 | Disposition: A | Discharge status: Home / Self Care | Payer: Medicaid Other | Attending: Emergency Medicine | Admitting: Emergency Medicine

## 2022-10-14 ENCOUNTER — Encounter (HOSPITAL_COMMUNITY)
Admission: RE | Disposition: A | Discharge status: Home / Self Care | Payer: Self-pay | Source: Home / Self Care | Attending: Obstetrics & Gynecology

## 2022-10-14 ENCOUNTER — Emergency Department (HOSPITAL_COMMUNITY): Payer: Medicaid Other

## 2022-10-14 ENCOUNTER — Encounter (HOSPITAL_COMMUNITY): Payer: Self-pay | Admitting: Pharmacy Technician

## 2022-10-14 ENCOUNTER — Ambulatory Visit (HOSPITAL_BASED_OUTPATIENT_CLINIC_OR_DEPARTMENT_OTHER): Payer: Medicaid Other | Admitting: Anesthesiology

## 2022-10-14 ENCOUNTER — Ambulatory Visit (HOSPITAL_COMMUNITY)
Admission: RE | Admit: 2022-10-14 | Discharge: 2022-10-14 | Disposition: A | Discharge status: Home / Self Care | Payer: Medicaid Other | Attending: Obstetrics & Gynecology | Admitting: Obstetrics & Gynecology

## 2022-10-14 ENCOUNTER — Encounter (HOSPITAL_COMMUNITY): Payer: Self-pay | Admitting: Obstetrics & Gynecology

## 2022-10-14 DIAGNOSIS — N920 Excessive and frequent menstruation with regular cycle: Secondary | ICD-10-CM | POA: Insufficient documentation

## 2022-10-14 DIAGNOSIS — F17210 Nicotine dependence, cigarettes, uncomplicated: Secondary | ICD-10-CM | POA: Diagnosis not present

## 2022-10-14 DIAGNOSIS — R102 Pelvic and perineal pain: Secondary | ICD-10-CM | POA: Diagnosis present

## 2022-10-14 DIAGNOSIS — I10 Essential (primary) hypertension: Secondary | ICD-10-CM | POA: Diagnosis not present

## 2022-10-14 DIAGNOSIS — J449 Chronic obstructive pulmonary disease, unspecified: Secondary | ICD-10-CM

## 2022-10-14 DIAGNOSIS — Z79899 Other long term (current) drug therapy: Secondary | ICD-10-CM | POA: Diagnosis not present

## 2022-10-14 HISTORY — PX: DILITATION & CURRETTAGE/HYSTROSCOPY WITH HYDROTHERMAL ABLATION: SHX5570

## 2022-10-14 LAB — BASIC METABOLIC PANEL
Anion gap: 7 (ref 5–15)
BUN: 11 mg/dL (ref 6–20)
CO2: 22 mmol/L (ref 22–32)
Calcium: 8.5 mg/dL — ABNORMAL LOW (ref 8.9–10.3)
Chloride: 106 mmol/L (ref 98–111)
Creatinine, Ser: 0.58 mg/dL (ref 0.44–1.00)
GFR, Estimated: >60 mL/min (ref >60)
Glucose, Bld: 113 mg/dL — ABNORMAL HIGH (ref 70–99)
Potassium: 3.4 mmol/L — ABNORMAL LOW (ref 3.5–5.1)
Sodium: 135 mmol/L (ref 135–145)

## 2022-10-14 LAB — CBC WITH DIFFERENTIAL/PLATELET
Abs Immature Granulocytes: 0.06 10*3/uL (ref 0.00–0.07)
Basophils Absolute: 0.1 10*3/uL (ref 0.0–0.1)
Basophils Relative: 0 %
Eosinophils Absolute: 0 10*3/uL (ref 0.0–0.5)
Eosinophils Relative: 0 %
HCT: 35.5 % — ABNORMAL LOW (ref 36.0–46.0)
Hemoglobin: 11.7 g/dL — ABNORMAL LOW (ref 12.0–15.0)
Immature Granulocytes: 0 %
Lymphocytes Relative: 6 %
Lymphs Abs: 0.8 10*3/uL (ref 0.7–4.0)
MCH: 29.5 pg (ref 26.0–34.0)
MCHC: 33 g/dL (ref 30.0–36.0)
MCV: 89.6 fL (ref 80.0–100.0)
Monocytes Absolute: 0.7 10*3/uL (ref 0.1–1.0)
Monocytes Relative: 5 %
Neutro Abs: 12.7 10*3/uL — ABNORMAL HIGH (ref 1.7–7.7)
Neutrophils Relative %: 89 %
Platelets: 279 10*3/uL (ref 150–400)
RBC: 3.96 MIL/uL (ref 3.87–5.11)
RDW: 14.5 % (ref 11.5–15.5)
WBC: 14.3 10*3/uL — ABNORMAL HIGH (ref 4.0–10.5)
nRBC: 0 % (ref 0.0–0.2)

## 2022-10-14 SURGERY — DILATATION & CURETTAGE/HYSTEROSCOPY WITH HYDROTHERMAL ABLATION
Anesthesia: General | Site: Vagina

## 2022-10-14 MED ORDER — LACTATED RINGERS IV SOLN: INTRAVENOUS | Status: DC

## 2022-10-14 MED ORDER — ACETAMINOPHEN 325 MG PO TABS: 650.0000 mg | ORAL_TABLET | Freq: Four times a day (QID) | ORAL | Status: AC | PRN

## 2022-10-14 MED ORDER — PROPOFOL 10 MG/ML IV BOLUS
INTRAVENOUS | Status: DC | PRN
Start: 2022-10-14 — End: 2022-10-14
  Administered 2022-10-14: 50 mg via INTRAVENOUS
  Administered 2022-10-14 (×2): 150 mg via INTRAVENOUS

## 2022-10-14 MED ORDER — DOCUSATE SODIUM 100 MG PO CAPS: 100.0000 mg | ORAL_CAPSULE | Freq: Two times a day (BID) | ORAL | 0 refills | Status: AC

## 2022-10-14 MED ORDER — CHLORHEXIDINE GLUCONATE 0.12 % MT SOLN
15.0000 mL | Freq: Once | OROMUCOSAL | Status: AC
  Administered 2022-10-14: 15 mL via OROMUCOSAL

## 2022-10-14 MED ORDER — KETOROLAC TROMETHAMINE 10 MG PO TABS: 10.0000 mg | ORAL_TABLET | Freq: Three times a day (TID) | ORAL | 0 refills | Status: AC | PRN

## 2022-10-14 MED ORDER — PROPOFOL 10 MG/ML IV BOLUS
INTRAVENOUS | Status: AC
  Filled 2022-10-14: qty 20

## 2022-10-14 MED ORDER — MIDAZOLAM HCL 2 MG/2ML IJ SOLN
INTRAMUSCULAR | Status: AC
  Filled 2022-10-14: qty 2

## 2022-10-14 MED ORDER — ONDANSETRON HCL 4 MG/2ML IJ SOLN: 4.0000 mg | Freq: Once | INTRAMUSCULAR | Status: AC

## 2022-10-14 MED ORDER — OXYCODONE HCL 5 MG/5ML PO SOLN: 5.0000 mg | Freq: Once | ORAL | Status: AC | PRN

## 2022-10-14 MED ORDER — DEXAMETHASONE SODIUM PHOSPHATE 10 MG/ML IJ SOLN
INTRAMUSCULAR | Status: AC
  Filled 2022-10-14: qty 1

## 2022-10-14 MED ORDER — ONDANSETRON HCL 4 MG/2ML IJ SOLN
INTRAMUSCULAR | Status: AC
  Filled 2022-10-14: qty 2

## 2022-10-14 MED ORDER — HYDROMORPHONE HCL 1 MG/ML IJ SOLN
1.0000 mg | Freq: Once | INTRAMUSCULAR | Status: AC
  Administered 2022-10-14: 1 mg via INTRAVENOUS
  Filled 2022-10-14: qty 1

## 2022-10-14 MED ORDER — SODIUM CHLORIDE 0.9 % IR SOLN
Status: DC | PRN
  Administered 2022-10-14: 1000 mL

## 2022-10-14 MED ORDER — LIDOCAINE HCL (CARDIAC) PF 50 MG/5ML IV SOSY
PREFILLED_SYRINGE | INTRAVENOUS | Status: DC | PRN
  Administered 2022-10-14: 80 mg via INTRAVENOUS

## 2022-10-14 MED ORDER — OXYCODONE HCL 5 MG PO TABS
5.0000 mg | ORAL_TABLET | Freq: Once | ORAL | Status: AC | PRN
  Administered 2022-10-14: 5 mg via ORAL
  Filled 2022-10-14: qty 1

## 2022-10-14 MED ORDER — ORAL CARE MOUTH RINSE: 15.0000 mL | Freq: Once | OROMUCOSAL | Status: AC

## 2022-10-14 MED ORDER — KETOROLAC TROMETHAMINE 30 MG/ML IJ SOLN
INTRAMUSCULAR | Status: DC | PRN
  Administered 2022-10-14: 30 mg via INTRAVENOUS

## 2022-10-14 MED ORDER — OXYCODONE HCL 5 MG PO TABS: 5.0000 mg | ORAL_TABLET | Freq: Four times a day (QID) | ORAL | 0 refills | Status: DC | PRN

## 2022-10-14 MED ORDER — KETOROLAC TROMETHAMINE 30 MG/ML IJ SOLN
30.0000 mg | Freq: Once | INTRAMUSCULAR | Status: AC
  Administered 2022-10-14: 30 mg via INTRAVENOUS
  Filled 2022-10-14: qty 1

## 2022-10-14 MED ORDER — LIDOCAINE-EPINEPHRINE 0.5 %-1:200000 IJ SOLN
INTRAMUSCULAR | Status: AC
  Filled 2022-10-14: qty 50

## 2022-10-14 MED ORDER — ONDANSETRON HCL 4 MG/2ML IJ SOLN
INTRAMUSCULAR | Status: AC
  Administered 2022-10-14: 4 mg via INTRAVENOUS
  Filled 2022-10-14: qty 2

## 2022-10-14 MED ORDER — FENTANYL CITRATE (PF) 100 MCG/2ML IJ SOLN
INTRAMUSCULAR | Status: AC
  Filled 2022-10-14: qty 2

## 2022-10-14 MED ORDER — ONDANSETRON HCL 4 MG/2ML IJ SOLN: 4.0000 mg | Freq: Once | INTRAMUSCULAR | Status: DC

## 2022-10-14 MED ORDER — LIDOCAINE-EPINEPHRINE 0.5 %-1:200000 IJ SOLN
INTRAMUSCULAR | Status: DC | PRN
  Administered 2022-10-14: 20 mL

## 2022-10-14 MED ORDER — MIDAZOLAM HCL 5 MG/5ML IJ SOLN
INTRAMUSCULAR | Status: DC | PRN
  Administered 2022-10-14: 2 mg via INTRAVENOUS

## 2022-10-14 MED ORDER — ONDANSETRON HCL 4 MG/2ML IJ SOLN: 4.0000 mg | Freq: Once | INTRAMUSCULAR | Status: DC | PRN

## 2022-10-14 MED ORDER — GABAPENTIN 300 MG PO CAPS: 300.0000 mg | ORAL_CAPSULE | Freq: Three times a day (TID) | ORAL | 0 refills | Status: DC

## 2022-10-14 MED ORDER — LIDOCAINE HCL (PF) 2 % IJ SOLN
INTRAMUSCULAR | Status: AC
  Filled 2022-10-14: qty 5

## 2022-10-14 MED ORDER — POVIDONE-IODINE 10 % EX SWAB: 2.0000 | Freq: Once | CUTANEOUS | Status: DC

## 2022-10-14 MED ORDER — ONDANSETRON HCL 4 MG/2ML IJ SOLN
INTRAMUSCULAR | Status: DC | PRN
  Administered 2022-10-14: 4 mg via INTRAVENOUS

## 2022-10-14 MED ORDER — FENTANYL CITRATE (PF) 100 MCG/2ML IJ SOLN
INTRAMUSCULAR | Status: DC | PRN
  Administered 2022-10-14 (×2): 50 ug via INTRAVENOUS

## 2022-10-14 MED ORDER — FENTANYL CITRATE PF 50 MCG/ML IJ SOSY
25.0000 ug | PREFILLED_SYRINGE | INTRAMUSCULAR | Status: DC | PRN
  Administered 2022-10-14: 50 ug via INTRAVENOUS
  Filled 2022-10-14: qty 1

## 2022-10-14 SURGICAL SUPPLY — 19 items
CLOTH BEACON ORANGE TIMEOUT ST (SAFETY) ×1 IMPLANT
COVER LIGHT HANDLE STERIS (MISCELLANEOUS) ×2 IMPLANT
GAUZE 4X4 16PLY ~~LOC~~+RFID DBL (SPONGE) ×1 IMPLANT
GLOVE BIO SURGEON STRL SZ 6.5 (GLOVE) ×1 IMPLANT
GLOVE BIOGEL PI IND STRL 7.0 (GLOVE) ×3 IMPLANT
GOWN STRL REUS W/ TWL LRG LVL3 (GOWN DISPOSABLE) ×1 IMPLANT
GOWN STRL REUS W/TWL LRG LVL3 (GOWN DISPOSABLE) ×2 IMPLANT
IV NS IRRIG 3000ML ARTHROMATIC (IV SOLUTION) ×1 IMPLANT
KIT TURNOVER CYSTO (KITS) ×1 IMPLANT
NS IRRIG 1000ML POUR BTL (IV SOLUTION) ×1 IMPLANT
PACK PERI GYN (CUSTOM PROCEDURE TRAY) ×1 IMPLANT
PAD ARMBOARD 7.5X6 YLW CONV (MISCELLANEOUS) ×1 IMPLANT
PAD TELFA 3X4 1S STER (GAUZE/BANDAGES/DRESSINGS) ×1 IMPLANT
POSITIONER HEAD 8X9X4 ADT (SOFTGOODS) ×1 IMPLANT
SET BASIN LINEN APH (SET/KITS/TRAYS/PACK) ×1 IMPLANT
SET GENESYS HTA PROCERVA (MISCELLANEOUS) ×1 IMPLANT
SUT VIC AB 0 CT1 27 (SUTURE) ×1
SUT VIC AB 0 CT1 27XBRD ANBCTR (SUTURE) IMPLANT
SYR CONTROL 10ML LL (SYRINGE) ×1 IMPLANT

## 2022-10-14 NOTE — Op Note (Signed)
Operative Report  PreOp: 1) Heavy menstrual bleeding PostOp: same Procedure:  Hysteroscopy, Dilation and Curettage, Endometrial ablation Surgeon: Dr. Myna Hidalgo Anesthesia: General Complications:none EBL:  10cc IVF:700cc  Findings: 7cm uterus with proliferative endometrium.  Both ostia visualized.  Specimens: endometrial curetings  Procedure: The patient was taken to the operating room where she underwent general anesthesia without difficulty. The patient was placed in a low lithotomy position using Allen stirrups. She was then prepped and draped in the normal sterile fashion. Sterile speculum was placed.  A single tooth tenaculum was placed on the anterior lip of the cervix. Cervical block was completed using 0.5% lidocaine with epinephrine.  The uterus was then sounded to 7cm. The endocervical canal was then serially dilated to 19French using Hank dilators to accommodate the hydrothermal ablation hysteroscopic apparatus.  The hysteroscope was inserted and findings were visualized as noted above.  The hysteroscope was removed and sharp curettage was performed. The tissue was sent to pathology.   The hydrothermal ablation hysteroscopic apparatus was then reinserted.  Cavity assessment was performed and passed with minimal leakage of fluid.  The hydrothermal ablation was then carried out as per protocol.   Complete ablation of the endometrium was observed and the hysteroscope was removed under direct visualization.  All instrument were then removed. 0-vicryl was used in a figure of eight fashion to repair a small tear from the tenaculum.   Excellent hemostasis was noted. The patient was repositioned to the supine position. The patient tolerated the procedure without any complications and taken to recovery in stable condition.   Myna Hidalgo, DO Attending Obstetrician & Gynecologist, [facility] for [employer], [facility]

## 2022-10-14 NOTE — Discharge Instructions (Addendum)
You were given Dilaudid at 1:44 PM today.  You may take your Percocet prescription starting at 6:00 pm.  Toradol injection was given at 3:36 PM today.  Please wait at least 8 hours before taking your Toradol tablets.  Please do not take these on an empty stomach.  Dr. Charlotta Newton will contact you tomorrow for follow-up.  Please apply heating pad to your lower abdomen to help with pain.  Expect continued abdominal pain and cramping for the next 24 hours.

## 2022-10-14 NOTE — ED Triage Notes (Signed)
Pt bib ems from home with reports of vaginal pain and bleeding after having a D&C and ablation this morning. Pt states pain 8/10. Given 4mg  zofran po due to nausea.

## 2022-10-14 NOTE — Anesthesia Procedure Notes (Signed)
Procedure Name: LMA Insertion Date/Time: 10/14/2022 7:41 AM  Performed by: Moshe Salisbury, CRNAPre-anesthesia Checklist: Patient identified, Patient being monitored, Emergency Drugs available, Timeout performed and Suction available Patient Re-evaluated:Patient Re-evaluated prior to induction Oxygen Delivery Method: Circle System Utilized Preoxygenation: Pre-oxygenation with 100% oxygen Induction Type: IV induction Ventilation: Mask ventilation without difficulty LMA: LMA inserted LMA Size: 3.0 Number of attempts: 1 Placement Confirmation: positive ETCO2 and breath sounds checked- equal and bilateral

## 2022-10-14 NOTE — Anesthesia Preprocedure Evaluation (Signed)
Anesthesia Evaluation  Patient identified by MRN, date of birth, ID band Patient awake    Reviewed: Allergy & Precautions, H&P , NPO status , Patient's Chart, lab work & pertinent test results, reviewed documented beta blocker date and time   Airway Mallampati: II  TM Distance: >3 FB Neck ROM: full    Dental no notable dental hx.    Pulmonary neg pulmonary ROS, COPD, Current Smoker and Patient abstained from smoking.   Pulmonary exam normal breath sounds clear to auscultation       Cardiovascular Exercise Tolerance: Good hypertension, negative cardio ROS  Rhythm:regular Rate:Normal     Neuro/Psych  Headaches PSYCHIATRIC DISORDERS Anxiety     negative neurological ROS  negative psych ROS   GI/Hepatic negative GI ROS, Neg liver ROS,GERD  ,,  Endo/Other  negative endocrine ROS    Renal/GU negative Renal ROS  negative genitourinary   Musculoskeletal   Abdominal   Peds  Hematology negative hematology ROS (+)   Anesthesia Other Findings   Reproductive/Obstetrics negative OB ROS                             Anesthesia Physical Anesthesia Plan  ASA: 3  Anesthesia Plan: General and General LMA   Post-op Pain Management:    Induction:   PONV Risk Score and Plan: Ondansetron  Airway Management Planned:   Additional Equipment:   Intra-op Plan:   Post-operative Plan:   Informed Consent: I have reviewed the patients History and Physical, chart, labs and discussed the procedure including the risks, benefits and alternatives for the proposed anesthesia with the patient or authorized representative who has indicated his/her understanding and acceptance.     Dental Advisory Given  Plan Discussed with: CRNA  Anesthesia Plan Comments:        Anesthesia Quick Evaluation

## 2022-10-14 NOTE — ED Provider Notes (Signed)
[facility] EMERGENCY DEPARTMENT AT [facility] Provider Note   CSN: [id number] Arrival date & time: 10/14/22  1224     History  Chief Complaint  Patient presents with   Vaginal Pain    Rita Baldwin is a 37 y.o. female.   Vaginal Pain Associated symptoms include abdominal pain. Pertinent negatives include no chest pain and no shortness of breath.        Rita Baldwin is a 37 y.o. 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  Lower abdominal pain that radiates from right to left she underwent D&C and ablation earlier this morning here by Dr. Charlotta Newton.  States she was having abdominal pain at time of discharge, pain progressively worsened.  Unrelieved after Percocet.  Called EMS for transport to the hospital shortly after she returned home.  States that she has had vaginal bleeding since the procedure, bleeding into a sanitary pad, filling one pad per hour  Home Medications Prior to Admission medications   Medication Sig Start Date End Date Taking? Authorizing Provider  acetaminophen (TYLENOL) 325 MG tablet Take 2 tablets (650 mg total) by mouth every 6 (six) hours as needed. 10/14/22   Myna Hidalgo, DO  albuterol (PROVENTIL HFA;VENTOLIN HFA) 108 (90 BASE) MCG/ACT inhaler Inhale 2 puffs into the lungs every 6 (six) hours as needed for wheezing or shortness of breath. 01/23/15   Jacquelin Hawking, PA-C  albuterol (PROVENTIL) (2.5 MG/3ML) 0.083% nebulizer solution Take 3 mLs (2.5 mg total) by nebulization every 6 (six) hours as needed for wheezing or shortness of breath. 01/15/15   Jacquelin Hawking, PA-C  citalopram (CELEXA) 40 MG tablet Take 40 mg by mouth daily.    [provider]  clonazePAM (KLONOPIN) 0.5 MG tablet Take 0.25-0.5 mg by mouth 3 (three) times daily as needed for anxiety. 08/20/22    [provider]  docusate sodium (COLACE) 100 MG capsule Take 1 capsule (100 mg total) by mouth 2 (two) times daily for 5 days. 10/14/22 10/19/22  Myna Hidalgo, DO  famotidine (PEPCID) 40 MG tablet Take 40-80 mg by mouth daily with supper.    [provider]  hydrOXYzine (ATARAX/VISTARIL) 25 MG tablet Take 25 mg by mouth at bedtime as needed (sleep). 09/24/19   [provider]  ketorolac (TORADOL) 10 MG tablet Take 1 tablet (10 mg total) by mouth every 8 (eight) hours as needed for up to 2 days. 10/14/22 10/16/22  Myna Hidalgo, DO  ondansetron (ZOFRAN) 8 MG tablet Take 4-8 mg by mouth 3 (three) times daily as needed for nausea or vomiting. 09/22/22   [provider]  oxyCODONE (OXY IR/ROXICODONE) 5 MG immediate release tablet Take 1 tablet (5 mg total) by mouth every 6 (six) hours as needed for up to 3 days for severe pain or breakthrough pain. 10/14/22 10/17/22  Myna Hidalgo, DO      Allergies    Morphine    Review of Systems   Review of Systems  Constitutional:  Negative for chills and fever.  Respiratory:  Negative for shortness of breath.   Cardiovascular:  Negative for chest pain.  Gastrointestinal:  Positive for abdominal pain, nausea and vomiting.  Genitourinary:  Positive for vaginal bleeding and vaginal pain. Negative for dysuria.  Musculoskeletal:  Positive for back pain.  Neurological:  Negative for weakness and light-headedness.    Physical Exam Updated Vital Signs  BP (!) 147/92   Pulse (!) 50   SpO2 97%  Physical Exam Vitals and nursing note reviewed. Exam conducted with a chaperone present.  Cardiovascular:     Rate and Rhythm: Normal rate and regular rhythm.     Pulses: Normal pulses.  Pulmonary:     Effort: Pulmonary effort is normal.     Breath sounds: Normal breath sounds.  Abdominal:     Tenderness: There is abdominal tenderness.     Comments: Diffuse lower abdominal tenderness.  Abd is soft.   Genitourinary:    Comments: Small  amount of blood in the vaginal vault.  No clotting. Musculoskeletal:        General: Normal range of motion.  Skin:    General: Skin is warm.     Capillary Refill: Capillary refill takes less than 2 seconds.  Neurological:     General: No focal deficit present.     Mental Status: She is alert.     Sensory: No sensory deficit.     Motor: No weakness.     ED Results / Procedures / Treatments   Labs (all labs ordered are listed, but only abnormal results are displayed) Labs Reviewed  CBC WITH DIFFERENTIAL/PLATELET - Abnormal; Notable for the following components:      Result Value   WBC 14.3 (*)    Hemoglobin 11.7 (*)    HCT 35.5 (*)    Neutro Abs 12.7 (*)    All other components within normal limits  BASIC METABOLIC PANEL - Abnormal; Notable for the following components:   Potassium 3.4 (*)    Glucose, Bld 113 (*)    Calcium 8.5 (*)    All other components within normal limits    EKG None  Radiology US Pelvis Complete  Result Date: 10/14/2022 CLINICAL DATA:  Vaginal bleeding.  D and C and ablation this morning EXAM: TRANSABDOMINAL ULTRASOUND OF PELVIS TECHNIQUE: Transabdominal ultrasound examination of the pelvis was performed including evaluation of the uterus, ovaries, adnexal regions, and pelvic cul-de-sac. COMPARISON:  Ultrasound 08/25/2022 FINDINGS: Uterus Measurements: 9.1 x 5.3 x 6.8 cm = volume: 171.0 mL. Heterogeneous myometrium. Focal contour deformity along the lower anterior uterine segment. Please correlate with history. This was seen on the prior examination. Please correlate for location of cesarean section. Endometrium Thickness: There is fluid along the endometrium. Endometrial thickness is somewhat poorly defined. There is also an echogenic shadowing area. With patient's history this could be air but has a differential. Right ovary Measurements: 3.3 x 2.2 x 2.3 cm = volume: 8.4 mL. Normal appearance/no adnexal mass. Left ovary Measurements: 5.4 x 4.8 x 4.1 cm =  volume: 54.6 mL. Anechoic structure associated with the left ovary measuring 5.5 x 3.8 x 3.8 cm. Other findings:  Moderate free fluid in the pelvis. Critical Value/emergent results were called by telephone at the time of interpretation on 10/14/2022 at 1:03 pm to provider [facility] Katilin Raynes , who verbally acknowledged these results. IMPRESSION: Abnormally thickened heterogeneous endometrium with fluid and some questionable air. This could be postprocedural. Please correlate with clinical findings. Stable contour deformity along the lower anterior uterine segment. Please correlate for prior intervention or other process. Moderate free fluid in the pelvis. 5.5 cm left-sided ovarian simple appearing cystic lesion. Recommend follow-up US in 3-6 months. Note: This recommendation does not apply to premenarchal patients or to those with increased risk (genetic, family history, elevated tumor markers or other high-risk factors) of ovarian cancer. Reference: Radiology 2019 Nov; 293(2):359-371. Electronically Signed  By: Karen Kays M.D.   On: 10/14/2022 16:13    Procedures Procedures    Medications Ordered in ED Medications  HYDROmorphone (DILAUDID) injection 1 mg (has no administration in time range)    ED Course/ Medical Decision Making/ A&P                                 Medical Decision Making Patient here in the emergency department after having D&C and ablation earlier this morning secondary to menorrhagia.  Here for evaluation of increasing abdominal pain unrelieved with Percocet   Patient nontoxic-appearing.  Spitting saliva into emesis bag and rocking back and forth on the stretcher upon arrival.  Having tenderness of the lower abdomen  I suspect pain is secondary to her recent procedure.  Her vital signs are reassuring, no hypotension.  Will address her pain here, check labs.  If pain controlled I anticipate discharge home  Amount and/or Complexity of Data Reviewed Labs: ordered.    Details:  Labs here white count 14,000, hemoglobin 11.7, chemistries without significant derangement. Radiology: ordered.    Details: Pelvic ultrasound shows abnormally thickened endometrium with fluid and questionable air, could be postprocedural.  Stable contour deformity of the lower anterior uterine segment seen on prior exam Discussion of management or test interpretation with external provider(s): Patient here for complaint of lower abdominal and vaginal pain after ablation and D&C for menorrhagia.  Called EMS shortly after she returned home.  Pain was unrelieved with Percocet.  Consulted OB/GYN on-call, Dr. Despina Hidden, asked that I call Dr. Charlotta Newton as she may still be in OR.  OR was called she has already left.  Will reach out to [facility].  Spoke with Dr. Ashok Pall recommends pelvic US   Patient hemodynamically stable, do not appreciate significant vaginal bleeding on my exam, amount of blood is to be expected considering her recent procedure.  Vital signs reassuring.  Spoke with Dr. Charlotta Newton and discussed findings.  Recommends gabapentin 300 mg 3 times daily and additional Toradol dosing here with instructions for patient to wait 8 hours before taking prescription Toradol at home.  She will follow-up with patient tomorrow  Patient upset with various things during her ER stay.  Complained about trash in the room, dirty floors with blood on the floor that was hers and amount of time taken to be seen.  Pain was addressed immediately after my evaluation.  Nursing staff addressed patient's concerns, on my recheck, after receiving IV Dilaudid, pain improved.  She was also given IV Toradol here as recommended by Dr. Charlotta Newton.    Ultrasound findings felt to be postprocedural.  Patient is tolerated oral fluids and food here without further vomiting.  Hemodynamically stable and felt to be appropriate for discharge home.  I explained that significant cramping is to be expected considering her recent procedure and reinforced application of  heat to help with her symptoms.  Risk Prescription drug management.           Final Clinical Impression(s) / ED Diagnoses Final diagnoses:  Pelvic pain    Rx / DC Orders ED Discharge Orders     None         Rosey Bath 10/14/22 1926    Gerhard Munch, MD 10/15/22 [phone]

## 2022-10-14 NOTE — Discharge Instructions (Addendum)
HOME INSTRUCTIONS  Please note any unusual or excessive bleeding, pain, swelling. Mild dizziness or drowsiness are normal for about 24 hours after surgery.   Shower when comfortable  Restrictions: No driving for 24 hours or while taking pain medications.  Activity:  Nothing in vagina (no tampons, douching, or intercourse) x 4 weeks; no tub baths for 4 weeks.  No heavy lifting (>20lbs) for the next 1-2 weeks. Vaginal spotting is expected but if your bleeding is heavy, period like,  please call the office   Diet:  You may return to your regular diet.  Do not eat large meals.  Eat small frequent meals throughout the day.  Continue to drink a good amount of water at least 6-8 glasses of water per day, hydration is very important for the healing process.  Pain Management: A prescription of Toradol has been sent in to take for the next 2 days.  Please take this medication with food.  Do NOT take this medication and ibuprofen together.  Once the Toradol is gone, you can return to your ibuprofen.  A prescription of oxycodone has also been sent in to take for breakthrough pain.  Take this medication along with the tylenol.  This medication will cause constipation, be sure to take a stool softener or use over the counter medication to help with constipation as needed.  Take over the counter tylenol as well as needed for pain.  You should alternate between the two medications for pain management.  You may also use a heating pack as needed.    Alcohol -- Avoid for 24 hours and while taking pain medications.  Nausea: Take sips of ginger ale or soda  Fever -- Call physician if temperature over 101 degrees  Follow up:  Please call the office at [phone] to schedule a follow up appointment.  If you experience fever (a temperature greater than 100.4), pain unrelieved by pain medication, shortness of breath, swelling of a single leg, or any other symptoms which are concerning to you please the office  immediately.

## 2022-10-14 NOTE — Transfer of Care (Signed)
Immediate Anesthesia Transfer of Care Note  Patient: Rita Baldwin  Procedure(s) Performed: DILATATION & CURETTAGE/HYSTEROSCOPY WITH HYDROTHERMAL ABLATION (Vagina )  Patient Location: PACU  Anesthesia Type:General  Level of Consciousness: awake  Airway & Oxygen Therapy: Patient Spontanous Breathing  Post-op Assessment: Report given to RN  Post vital signs: Reviewed and stable  Last Vitals:  Vitals Value Taken Time  BP 144/86 10/14/22 0837  Temp    Pulse 61 10/14/22 0838  Resp 11 10/14/22 0838  SpO2 98 % 10/14/22 0838  Vitals shown include unfiled device data.  Last Pain:  Vitals:   10/14/22 0648  TempSrc: Oral  PainSc: 0-No pain      Patients Stated Pain Goal: 6 (10/14/22 2376)  Complications: No notable events documented.

## 2022-10-15 ENCOUNTER — Encounter (HOSPITAL_COMMUNITY): Payer: Self-pay | Admitting: Obstetrics & Gynecology

## 2022-10-17 ENCOUNTER — Other Ambulatory Visit: Payer: Self-pay | Admitting: Obstetrics & Gynecology

## 2022-10-17 ENCOUNTER — Other Ambulatory Visit (INDEPENDENT_AMBULATORY_CARE_PROVIDER_SITE_OTHER): Payer: Self-pay | Admitting: Obstetrics & Gynecology

## 2022-10-17 ENCOUNTER — Telehealth: Payer: Self-pay | Admitting: Obstetrics & Gynecology

## 2022-10-17 DIAGNOSIS — Z9889 Other specified postprocedural states: Secondary | ICD-10-CM

## 2022-10-17 MED ORDER — OXYCODONE HCL 5 MG PO TABS
5.0000 mg | ORAL_TABLET | Freq: Four times a day (QID) | ORAL | 0 refills | Status: AC | PRN
Start: 2022-10-17 — End: 2022-10-22

## 2022-10-17 NOTE — Anesthesia Postprocedure Evaluation (Signed)
Anesthesia Post Note  Patient: Rita Baldwin  Procedure(s) Performed: DILATATION & CURETTAGE/HYSTEROSCOPY WITH HYDROTHERMAL ABLATION (Vagina )  Patient location during evaluation: Phase II Anesthesia Type: General Level of consciousness: awake Pain management: pain level controlled Vital Signs Assessment: post-procedure vital signs reviewed and stable Respiratory status: spontaneous breathing and respiratory function stable Cardiovascular status: blood pressure returned to baseline and stable Postop Assessment: no headache and no apparent nausea or vomiting Anesthetic complications: no Comments: Late entry   No notable events documented.   Last Vitals:  Vitals:   10/14/22 0930 10/14/22 0933  BP:  124/78  Pulse: 66   Resp: 14   Temp: 36.8 C   SpO2: 98% 98%    Last Pain:  Vitals:   10/15/22 1031  TempSrc:   PainSc: 3                  Windell Norfolk

## 2022-10-17 NOTE — Telephone Encounter (Signed)
Pt states she needs a call today.

## 2022-10-17 NOTE — Progress Notes (Signed)
Refill of oxycodone Pt still struggling with sleep and pain  Myna Hidalgo, DO Attending Obstetrician & Gynecologist, [facility] [employer], [facility]

## 2022-10-23 ENCOUNTER — Encounter: Payer: Self-pay | Admitting: Obstetrics & Gynecology

## 2022-10-23 ENCOUNTER — Telehealth (INDEPENDENT_AMBULATORY_CARE_PROVIDER_SITE_OTHER): Payer: Medicaid Other | Admitting: Obstetrics & Gynecology

## 2022-10-23 DIAGNOSIS — Z9889 Other specified postprocedural states: Secondary | ICD-10-CM

## 2022-10-23 DIAGNOSIS — F411 Generalized anxiety disorder: Secondary | ICD-10-CM

## 2022-10-23 DIAGNOSIS — N83201 Unspecified ovarian cyst, right side: Secondary | ICD-10-CM

## 2022-10-23 NOTE — Progress Notes (Signed)
TELEHEALTH GYNECOLOGY VISIT ENCOUNTER NOTE  Provider location: [facility]   Patient location: Home  I connected with Rita Baldwin on 10/23/22 at 11:50 AM EDT by telephone and verified that I am speaking with the correct person using two identifiers. Patient was unable to do MyChart audiovisual encounter due to technical difficulties, she tried several times.    I discussed the limitations, risks, security and privacy concerns of performing an evaluation and management service by telephone and the availability of in person appointments. I also discussed with the patient that there may be a patient responsible charge related to this service. The patient expressed understanding and agreed to proceed.   History:  Rita Baldwin is a 37 y.o. [phone] female being evaluated today for postop follow up.  S/p hysteroscopy, D&C, Hydrothermal ablation on 7/30.  Pt struggled with pain management and advised follow up in one week to check in.  Today she notes that she has stopped taking the oxycodone.  She reports that she "overdid it" and feels like her anxiety and hormones are getting the best of her.  Denies fever, some chills.  It seems as though pain has started to improve.  Also sounds as though bleeding has started to improve.  Pt concerned about [place] report- reviewed results.  Discussed benign cyst of right ovary- recommendation for repeat US in 3-15mos.  Also reviewed pathology report- normal benign endometrium     Past Medical History:  Diagnosis Date   Anxiety 03/17/2010   Arthritis    Bronchitis    Chlamydia 03/18/2007   COPD (chronic obstructive pulmonary disease) (HCC)    Drug abuse (HCC)    GERD (gastroesophageal reflux disease) age 78   Headache    Hypertension 03/17/2013   Overdose    "I overdosed on tylenol as a kid"   Palpitations    Panic attacks    Precancerous changes of the cervix as a child   PTSD (post-traumatic stress  disorder)    Scoliosis fifth grade   UTI (lower urinary tract infection)    Past Surgical History:  Procedure Laterality Date   CESAREAN SECTION     C/S x 2   COLONOSCOPY WITH PROPOFOL N/A 11/02/2019   Procedure: COLONOSCOPY WITH PROPOFOL;  Surgeon: Dolores Frame, MD;  [facility];  Service: Gastroenterology;  Laterality: N/A;   DILITATION & CURRETTAGE/HYSTROSCOPY WITH HYDROTHERMAL ABLATION N/A 10/14/2022   Procedure: DILATATION & CURETTAGE/HYSTEROSCOPY WITH HYDROTHERMAL ABLATION;  Surgeon: Myna Hidalgo, DO;  [facility]: [facility];  Service: Gynecology;  Laterality: N/A;   ESOPHAGOGASTRODUODENOSCOPY (EGD) WITH PROPOFOL N/A 11/02/2019   Procedure: ESOPHAGOGASTRODUODENOSCOPY (EGD) WITH PROPOFOL;  Surgeon: Dolores Frame, MD;  [facility];  Service: Gastroenterology;  Laterality: N/A;  730   MULTIPLE TOOTH EXTRACTIONS     POLYPECTOMY  11/02/2019   Procedure: POLYPECTOMY;  Surgeon: Dolores Frame, MD;  [facility];  Service: Gastroenterology;;   TUBAL LIGATION     The following portions of the patient's history were reviewed and updated as appropriate: allergies, current medications, past family history, past medical history, past social history, past surgical history and problem list.   Review of Systems:  Pertinent items noted in HPI and remainder of comprehensive ROS otherwise negative.  Physical Exam:   General:  Alert, oriented and cooperative.   Mental Status: Normal mood and affect perceived. Normal judgment and thought content.  Physical exam deferred due to nature of the encounter  Labs  and Imaging Results for orders placed or performed during the hospital encounter of 10/14/22 (from the past 336 hour(s))  CBC with Differential   Collection Time: 10/14/22  1:14 PM  Result Value Ref Range   WBC 14.3 (H) 4.0 - 10.5 K/uL   RBC 3.96 3.87 - 5.11 MIL/uL   Hemoglobin 11.7 (L) 12.0 - 15.0 g/dL   HCT 16.1 (L) 09.6 -  46.0 %   MCV 89.6 80.0 - 100.0 fL   MCH 29.5 26.0 - 34.0 pg   MCHC 33.0 30.0 - 36.0 g/dL   RDW 04.5 40.9 - 81.1 %   Platelets 279 150 - 400 K/uL   nRBC 0.0 0.0 - 0.2 %   Neutrophils Relative % 89 %   Neutro Abs 12.7 (H) 1.7 - 7.7 K/uL   Lymphocytes Relative 6 %   Lymphs Abs 0.8 0.7 - 4.0 K/uL   Monocytes Relative 5 %   Monocytes Absolute 0.7 0.1 - 1.0 K/uL   Eosinophils Relative 0 %   Eosinophils Absolute 0.0 0.0 - 0.5 K/uL   Basophils Relative 0 %   Basophils Absolute 0.1 0.0 - 0.1 K/uL   Immature Granulocytes 0 %   Abs Immature Granulocytes 0.06 0.00 - 0.07 K/uL  Basic metabolic panel   Collection Time: 10/14/22  1:14 PM  Result Value Ref Range   Sodium 135 135 - 145 mmol/L   Potassium 3.4 (L) 3.5 - 5.1 mmol/L   Chloride 106 98 - 111 mmol/L   CO2 22 22 - 32 mmol/L   Glucose, Bld 113 (H) 70 - 99 mg/dL   BUN 11 6 - 20 mg/dL   Creatinine, Ser 9.14 0.44 - 1.00 mg/dL   Calcium 8.5 (L) 8.9 - 10.3 mg/dL   GFR, Estimated >78 >29 mL/min   Anion gap 7 5 - 15  Results for orders placed or performed during the hospital encounter of 10/14/22 (from the past 336 hour(s))  Surgical pathology   Collection Time: 10/14/22  8:01 AM  Result Value Ref Range   SURGICAL PATHOLOGY      SURGICAL PATHOLOGY CASE: [id number] PATIENT: Rosine Beat Surgical Pathology Report     Clinical History: Heavy menstrual bleeding- N92.0 (las)     FINAL MICROSCOPIC DIAGNOSIS:  A. ENDOMETRIAL, CURETTAGE: Dyssynchronous/altered secretory phase endometrium (see comment) Negative for polyp, breakdown, atypia, hyperplasia and carcinoma  COMMENT:  Sections show abundant endometrium with rounded tubular, dilated glands lined by columnar mitotically inactive epithelium exhibiting secretory changes with abundant luminal secretions within a stroma showing prominent spiral arterioles and confluent decidual change.  No plasma cells are identified.  As there is a greater than 3-day  dyssynchrony between the glands and stroma, this represents an altered secretory phase.  This pattern has been associated with an altered cycle and/or exogenous hormones.  Clinical correlation is recommended.   GROSS DESCRIPTION:  Specimen is received in formalin and consists of a  2.5 x 2.0 x 0.4 cm aggregate of tan-pink soft tissue.  The specimen is entirely submitted in 2 cassettes.  Lovey Newcomer 10/14/2022)    Final Diagnosis performed by Jerene Bears, MD.   Electronically signed 10/15/2022 Technical component performed at [facility], [address] [place], [place] [address].  Professional component performed at [employer]. [facility], [address], [place], [place] [address].  Immunohistochemistry Technical component (if applicable) was performed at [facility]. [address], [place], [place] [address].   IMMUNOHISTOCHEMISTRY DISCLAIMER (if applicable): Some of these immunohistochemical  stains may have been developed and the performance characteristics determine by [facility]. Some may not have been cleared or approved by the U.S. Food and Drug Administration. The FDA has determined that such clearance or approval is not necessary. This test is used for clinical purposes . It should not be regarded as investigational or for research. This laboratory is certified under the Clinical Laboratory Improvement Amendments of 1988 (CLIA-88) as qualified to perform high complexity clinical laboratory testing.  The controls stained appropriately.   IHC stains are performed on formalin fixed, paraffin embedded tissue using a 3,3"diaminobenzidine (DAB) chromogen and Leica Bond Autostainer System. The staining intensity of the nucleus is score manually and is reported as the percentage of tumor cell nuclei demonstrating specific nuclear staining. The specimens are fixed in 10% Neutral Formalin for at least 6 hours and up  to 72hrs. These tests are validated on decalcified tissue. Results should be interpreted with caution given the possibility of false negative results on decalcified specimens. Antibody Clones are as follows ER-clone 63F, PR-clone 16, Ki67- clone MM1. Some of these immunohistochemical stains may have been developed and the performance c haracteristics determined by [facility].   Results for orders placed or performed during the hospital encounter of 10/10/22 (from the past 336 hour(s))  CBC   Collection Time: 10/10/22 12:27 PM  Result Value Ref Range   WBC 6.0 4.0 - 10.5 K/uL   RBC 4.40 3.87 - 5.11 MIL/uL   Hemoglobin 12.8 12.0 - 15.0 g/dL   HCT 16.1 09.6 - 04.5 %   MCV 89.3 80.0 - 100.0 fL   MCH 29.1 26.0 - 34.0 pg   MCHC 32.6 30.0 - 36.0 g/dL   RDW 40.9 81.1 - 91.4 %   Platelets 324 150 - 400 K/uL   nRBC 0.0 0.0 - 0.2 %  Pregnancy, urine   Collection Time: 10/10/22 12:27 PM  Result Value Ref Range   Preg Test, Ur NEGATIVE NEGATIVE   US Pelvis Complete  Result Date: 10/14/2022 CLINICAL DATA:  Vaginal bleeding.  D and C and ablation this morning EXAM: TRANSABDOMINAL ULTRASOUND OF PELVIS TECHNIQUE: Transabdominal ultrasound examination of the pelvis was performed including evaluation of the uterus, ovaries, adnexal regions, and pelvic cul-de-sac. COMPARISON:  Ultrasound 08/25/2022 FINDINGS: Uterus Measurements: 9.1 x 5.3 x 6.8 cm = volume: 171.0 mL. Heterogeneous myometrium. Focal contour deformity along the lower anterior uterine segment. Please correlate with history. This was seen on the prior examination. Please correlate for location of cesarean section. Endometrium Thickness: There is fluid along the endometrium. Endometrial thickness is somewhat poorly defined. There is also an echogenic shadowing area. With patient's history this could be air but has a differential. Right ovary Measurements: 3.3 x 2.2 x 2.3 cm = volume: 8.4 mL. Normal appearance/no adnexal mass. Left ovary  Measurements: 5.4 x 4.8 x 4.1 cm = volume: 54.6 mL. Anechoic structure associated with the left ovary measuring 5.5 x 3.8 x 3.8 cm. Other findings:  Moderate free fluid in the pelvis. Critical Value/emergent results were called by telephone at the time of interpretation on 10/14/2022 at 1:03 pm to provider [facility] TRIPLETT , who verbally acknowledged these results. IMPRESSION: Abnormally thickened heterogeneous endometrium with fluid and some questionable air. This could be postprocedural. Please correlate with clinical findings. Stable contour deformity along the lower anterior uterine segment. Please correlate for prior intervention or other process. Moderate free fluid in the pelvis. 5.5 cm left-sided ovarian simple appearing cystic lesion. Recommend follow-up US in 3-6 months.  Note: This recommendation does not apply to premenarchal patients or to those with increased risk (genetic, family history, elevated tumor markers or other high-risk factors) of ovarian cancer. Reference: Radiology 2019 Nov; 293(2):359-371. Electronically Signed   By: Karen Kays M.D.   On: 10/14/2022 16:13      Assessment and Plan:   -s/p endometrial ablation -Anxiety -R Ovarian cyst   Reassured pt that she is meeting milestones appropriately Discussed slow/steady progress []  plan for follow up pelvic US in 3-30mos to re-evaluate ovarian cyst   I discussed the assessment and treatment plan with the patient. The patient was provided an opportunity to ask questions and all were answered. The patient agreed with the plan and demonstrated an understanding of the instructions.   The patient was advised to call back or seek an in-person evaluation/go to the ED if the symptoms worsen or if the condition fails to improve as anticipated.  I provided 10 minutes of non-face-to-face time during this encounter, which including reviewing the chart, talking to patient and documentation.   Sharon Seller, DO Center for [employer], [facility]

## 2022-11-13 ENCOUNTER — Encounter: Payer: Self-pay | Admitting: Obstetrics & Gynecology

## 2022-11-13 ENCOUNTER — Ambulatory Visit (INDEPENDENT_AMBULATORY_CARE_PROVIDER_SITE_OTHER): Payer: Medicaid Other | Admitting: Obstetrics & Gynecology

## 2022-11-13 ENCOUNTER — Other Ambulatory Visit (HOSPITAL_COMMUNITY)
Admission: RE | Admit: 2022-11-13 | Discharge: 2022-11-13 | Disposition: A | Discharge status: Home / Self Care | Payer: Medicaid Other | Source: Ambulatory Visit | Attending: Obstetrics & Gynecology | Admitting: Obstetrics & Gynecology

## 2022-11-13 VITALS — BP 120/77 | HR 77 | Ht 63.0 in | Wt 113.0 lb

## 2022-11-13 DIAGNOSIS — N898 Other specified noninflammatory disorders of vagina: Secondary | ICD-10-CM | POA: Diagnosis present

## 2022-11-13 DIAGNOSIS — Z9889 Other specified postprocedural states: Secondary | ICD-10-CM

## 2022-11-13 DIAGNOSIS — R102 Pelvic and perineal pain: Secondary | ICD-10-CM | POA: Diagnosis not present

## 2022-11-13 NOTE — Addendum Note (Signed)
Addended by: Annamarie Dawley on: 11/13/2022 11:18 AM   Modules accepted: Orders

## 2022-11-13 NOTE — Progress Notes (Signed)
GYN VISIT Patient name: Rita Baldwin MRN [id number]  Date of birth: [birth year] Chief Complaint:   Follow-up (Vaginal discharge)  History of Present Illness:   Rita Baldwin is a 37 y.o. [id number] female being seen today for the following concerns:  -s/p Hysteroscopy, Ablation 7/30  "Funky discharge"- yellow to brown- previously had an odor.  Minimal itching.  Still noting some pain on the right side- come/goes.  Pain is quick and last less than a minute- it does not require medication.  She reports no vaginal bleeding.  Denies dyspareunia.  Denies bowel or urinary issues.  ER records reviewed.  [place] completed 7/30- immediately following procedure- 5cm simple ovarian cyst noted.  Normal right ovary.  Uterine findings suggestive of prior C-section and recent ablation.  No acute abnormalities noted.  Notes considerable weight loss and low vitamin D- followed by PCP.     No LMP recorded. (Menstrual status: Irregular Periods).    Review of Systems:   Pertinent items are noted in HPI Denies fever/chills, dizziness, headaches, visual disturbances, fatigue, shortness of breath, chest pain, abdominal pain, vomiting. Pertinent History Reviewed:   Past Surgical History:  Procedure Laterality Date   CESAREAN SECTION     C/S x 2   COLONOSCOPY WITH PROPOFOL N/A 11/02/2019   Procedure: COLONOSCOPY WITH PROPOFOL;  Surgeon: Dolores Frame, MD;  [facility];  Service: Gastroenterology;  Laterality: N/A;   DILITATION & CURRETTAGE/HYSTROSCOPY WITH HYDROTHERMAL ABLATION N/A 10/14/2022   Procedure: DILATATION & CURETTAGE/HYSTEROSCOPY WITH HYDROTHERMAL ABLATION;  Surgeon: Myna Hidalgo, DO;  Location: [facility];  Service: Gynecology;  Laterality: N/A;   ESOPHAGOGASTRODUODENOSCOPY (EGD) WITH PROPOFOL N/A 11/02/2019   Procedure: ESOPHAGOGASTRODUODENOSCOPY (EGD) WITH PROPOFOL;  Surgeon: Dolores Frame, MD;  [facility];  Service: Gastroenterology;   Laterality: N/A;  730   MULTIPLE TOOTH EXTRACTIONS     POLYPECTOMY  11/02/2019   Procedure: POLYPECTOMY;  Surgeon: Marguerita Merles, Reuel Boom, MD;  [facility];  Service: Gastroenterology;;   TUBAL LIGATION      Past Medical History:  Diagnosis Date   Anxiety 03/17/2010   Arthritis    Bronchitis    Chlamydia 03/18/2007   COPD (chronic obstructive pulmonary disease) (HCC)    Drug abuse (HCC)    GERD (gastroesophageal reflux disease) age 81   Headache    Hypertension 03/17/2013   Overdose    "I overdosed on tylenol as a kid"   Palpitations    Panic attacks    Precancerous changes of the cervix as a child   PTSD (post-traumatic stress disorder)    Scoliosis fifth grade   UTI (lower urinary tract infection)    Reviewed problem list, medications and allergies. Physical Assessment:   Vitals:   11/13/22 0857  BP: 120/77  Pulse: 77  Weight: 113 lb (51.3 kg)  Height: 5\' 3"  (1.6 m)  Body mass index is 20.02 kg/m.       Physical Examination:   General appearance: alert, well appearing, and in no distress  Psych: mood appropriate, normal affect  Skin: warm & dry   Cardiovascular: normal heart rate noted  Respiratory: normal respiratory effort, no distress  Abdomen: soft, non-tender, no rebound, no guarding, no reproducible pain  Pelvic: VULVA: normal appearing vulva with no masses, tenderness or lesions, VAGINA: normal appearing vagina with normal color-  no abnormal discharge noted.  CERVIX: normal appearing cervix without discharge or lesions, UTERUS: uterus is normal size, shape, consistency and nontender, ADNEXA: right sided tenderness noted-  no masses appreciated  Extremities: no edema   Chaperone: Faith Rogue    Assessment & Plan:  1) Vaginal discharge -plan to r/o underlying infection -discussed that some irregular discharge s/p ablation may be normal and supsect it should continue to improve with time  2) Pelvic pain -reviewed prior US, cyst noted on  left -should pain continue would consider repeat US in 2-3 weeks   No orders of the defined types were placed in this encounter.   Return if symptoms worsen or fail to improve.   Myna Hidalgo, DO Attending Obstetrician & Gynecologist, [facility] for [employer], [facility]

## 2022-11-20 LAB — CERVICOVAGINAL ANCILLARY ONLY
Bacterial Vaginitis (gardnerella): NEGATIVE
Candida Glabrata: NEGATIVE
Candida Vaginitis: NEGATIVE
Comment: NEGATIVE
Comment: NEGATIVE
Comment: NEGATIVE

## 2023-02-03 ENCOUNTER — Telehealth: Payer: Self-pay | Admitting: Obstetrics & Gynecology

## 2023-02-03 ENCOUNTER — Telehealth: Payer: Self-pay

## 2023-02-03 ENCOUNTER — Encounter: Payer: Self-pay | Admitting: Obstetrics & Gynecology

## 2023-02-03 NOTE — Telephone Encounter (Signed)
Telephone call to patient after discussing patient concerns with Dr. Charlotta Newton. Patient reassurred that Dr. Charlotta Newton is aware of her desire for an ultrasound for and full workup as she is continuing with pain and weight loss. Patient has and appointment on 02/10/23 with Dr. Charlotta Newton.

## 2023-02-03 NOTE — Telephone Encounter (Signed)
Patient called stating she was still having pain in her left side. Has lost significant weight and is seeing some things that are concerning to her. Would like to get the full exam, std testing and ultrasound to rule out etiology. Could you call patient to get more understanding about issues?  Please advise.

## 2023-02-03 NOTE — Telephone Encounter (Signed)
Telephone call to patient. Patient concerned about weight loss and wants ultrasound and pap. Patient scheduled an appointment for 02/10/23 at 10:50. Patient wants call back to verify appointment and ultrasound will get everything done that she needs.

## 2023-02-10 ENCOUNTER — Other Ambulatory Visit: Payer: Self-pay

## 2023-02-10 ENCOUNTER — Other Ambulatory Visit (HOSPITAL_COMMUNITY): Payer: Self-pay | Admitting: Obstetrics & Gynecology

## 2023-02-10 ENCOUNTER — Ambulatory Visit (INDEPENDENT_AMBULATORY_CARE_PROVIDER_SITE_OTHER): Payer: Medicaid Other | Admitting: Obstetrics & Gynecology

## 2023-02-10 ENCOUNTER — Encounter: Payer: Self-pay | Admitting: Obstetrics & Gynecology

## 2023-02-10 ENCOUNTER — Other Ambulatory Visit (HOSPITAL_COMMUNITY)
Admission: RE | Admit: 2023-02-10 | Discharge: 2023-02-10 | Disposition: A | Discharge status: Home / Self Care | Payer: Medicaid Other | Source: Ambulatory Visit | Attending: Obstetrics & Gynecology | Admitting: Obstetrics & Gynecology

## 2023-02-10 VITALS — BP 142/89 | HR 106 | Ht 63.0 in | Wt 100.2 lb

## 2023-02-10 DIAGNOSIS — R030 Elevated blood-pressure reading, without diagnosis of hypertension: Secondary | ICD-10-CM

## 2023-02-10 DIAGNOSIS — R102 Pelvic and perineal pain: Secondary | ICD-10-CM

## 2023-02-10 DIAGNOSIS — R634 Abnormal weight loss: Secondary | ICD-10-CM | POA: Diagnosis not present

## 2023-02-10 DIAGNOSIS — Z113 Encounter for screening for infections with a predominantly sexual mode of transmission: Secondary | ICD-10-CM | POA: Insufficient documentation

## 2023-02-10 DIAGNOSIS — Z8742 Personal history of other diseases of the female genital tract: Secondary | ICD-10-CM

## 2023-02-10 DIAGNOSIS — N6321 Unspecified lump in the left breast, upper outer quadrant: Secondary | ICD-10-CM | POA: Diagnosis not present

## 2023-02-10 DIAGNOSIS — F419 Anxiety disorder, unspecified: Secondary | ICD-10-CM

## 2023-02-10 DIAGNOSIS — N63 Unspecified lump in unspecified breast: Secondary | ICD-10-CM

## 2023-02-10 NOTE — Addendum Note (Signed)
Addended by: Annamarie Dawley on: 02/10/2023 01:45 PM   Modules accepted: Orders

## 2023-02-10 NOTE — Progress Notes (Signed)
GYN VISIT Patient name: Rita Baldwin MRN [id number]  Date of birth: 11-13-1985 Chief Complaint:   Follow-up (Worried about weight loss, wants ultrasound)  History of Present Illness:   Rita Baldwin is a 37 y.o. [phone] female being seen today for the following reasons:  Pelvic pain/pelvic mass:  Feels like she has a mass on her left side and notes that she may have over exerted herself.  She reports it has been there s/p endometrial ablation.  Notes considerable discomfort to have a BM- taking zofran 3x daily.  She is aware that this medication may make her symptoms worse.. She has been struggling with constipation.  Per pt seen by GI- work up negative.  Notes discomfort with BMs and worsening dyspareunia.  Pt notes h/o endometriosis- diagnosed clinically.  Pt also desires STI screening.  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   Denies vaginal discharge, itching or irritation.  She did not go into details, but she notes considerable stress at home.  She has several family members who have been diagnosed with cancer and she is very concerned about her risk  No LMP recorded. Patient has had an ablation.    Review of Systems:   Pertinent items are noted in HPI Denies fever/chills, dizziness, headaches, visual disturbances, fatigue, shortness of breath, chest pain. Pertinent History Reviewed:   Past Surgical History:  Procedure Laterality Date   CESAREAN SECTION     C/S x 2   COLONOSCOPY WITH PROPOFOL N/A 11/02/2019   Procedure: COLONOSCOPY WITH PROPOFOL;  Surgeon: Dolores Frame, MD;  [facility];  Service: Gastroenterology;  Laterality: N/A;   DILITATION & CURRETTAGE/HYSTROSCOPY WITH HYDROTHERMAL ABLATION N/A 10/14/2022   Procedure: DILATATION & CURETTAGE/HYSTEROSCOPY WITH HYDROTHERMAL ABLATION;  Surgeon: Myna Hidalgo, DO;  Location: [facility];  Service: Gynecology;  Laterality: N/A;   ESOPHAGOGASTRODUODENOSCOPY (EGD) WITH PROPOFOL N/A  11/02/2019   Procedure: ESOPHAGOGASTRODUODENOSCOPY (EGD) WITH PROPOFOL;  Surgeon: Dolores Frame, MD;  [facility];  Service: Gastroenterology;  Laterality: N/A;  730   MULTIPLE TOOTH EXTRACTIONS     POLYPECTOMY  11/02/2019   Procedure: POLYPECTOMY;  Surgeon: Marguerita Merles, Reuel Boom, MD;  [facility];  Service: Gastroenterology;;   TUBAL LIGATION      Past Medical History:  Diagnosis Date   Anxiety 03/17/2010   Arthritis    Bronchitis    Chlamydia 03/18/2007   COPD (chronic obstructive pulmonary disease) (HCC)    Drug abuse (HCC)    GERD (gastroesophageal reflux disease) age 50   Headache    Hypertension 03/17/2013   Overdose    "I overdosed on tylenol as a kid"   Palpitations    Panic attacks    Precancerous changes of the cervix as a child   PTSD (post-traumatic stress disorder)    Scoliosis fifth grade   UTI (lower urinary tract infection)    Reviewed problem list, medications and allergies. Physical Assessment:   Vitals:   02/10/23 1103 02/10/23 1108  BP: (!) 154/91 (!) 142/89  Pulse: (!) 115 (!) 106  Weight: 100 lb 3.2 oz (45.5 kg)   Height: 5\' 3"  (1.6 m)   Body mass index is 17.75 kg/m.       Physical Examination:   General appearance: alert, well appearing, and in no distress  Psych: mood appropriate, normal affect  Skin: warm & dry   Cardiovascular: normal heart rate noted  Breast: upper outer left breast with density noted, no other abnormalities appreciated  Respiratory: normal  respiratory effort, no distress  Abdomen: flat, soft and non-tender, no rebound, no guarding, no visible mass noted When standing slight protrusion of left sided noted- obvious hernia not well appreciated  Pelvic: VULVA: HSV lesion noted right labia majora, otherwise no other lesions seen.  VAGINA: normal appearing vagina with normal color and discharge, no lesions, CERVIX: normal appearing cervix without discharge or lesions, UTERUS: uterus is  normal size, shape, consistency and nontender, ADNEXA: normal adnexa in size, nontender and no masses  Extremities: no edema   Chaperone:  Dr. Elberta Fortis     Assessment & Plan:  1) Pelvic pain, history of ovarian cyst, ?pelvic mass -No acute abnormalities noted on exam -Will plan for pelvic ultrasound, further management pending results -If negative would consider CT to evaluate for inguinal hernia  2) Breast density -imaging to be completed, further management pending results  3) STI screening- to be completed  4) GI concerns- Weight loss, nausea -Patient has quit marijuana about 2 weeks ago -Reviewed with patient that it may take up to 30 days before she may see improvement -Encouraged follow-up with either PCP or GI  5) Anxiety, anxiety about health -Suspect elevated BP due to her current anxiety -Management per PCP   Orders Placed This Encounter  Procedures   US PELVIC COMPLETE WITH TRANSVAGINAL   MM 3D DIAGNOSTIC MAMMOGRAM BILATERAL BREAST   RPR   HIV Antibody (routine testing w rflx)    Return in about 1 year (around 02/10/2024) for please schedule pelvic US (our office if possible), and diagnostic mammogram.   Myna Hidalgo, DO Attending Obstetrician & Gynecologist, [facility], [facility]

## 2023-02-11 LAB — RPR: RPR Ser Ql: NONREACTIVE

## 2023-02-11 LAB — HIV ANTIBODY (ROUTINE TESTING W REFLEX): HIV Screen 4th Generation wRfx: NONREACTIVE

## 2023-02-13 ENCOUNTER — Other Ambulatory Visit: Payer: Self-pay | Admitting: Obstetrics & Gynecology

## 2023-02-13 DIAGNOSIS — A599 Trichomoniasis, unspecified: Secondary | ICD-10-CM

## 2023-02-13 LAB — CERVICOVAGINAL ANCILLARY ONLY
Chlamydia: NEGATIVE
Comment: NEGATIVE
Comment: NEGATIVE
Comment: NORMAL
Neisseria Gonorrhea: NEGATIVE
Trichomonas: POSITIVE — AB

## 2023-02-13 MED ORDER — METRONIDAZOLE 500 MG PO TABS: 500.0000 mg | ORAL_TABLET | Freq: Two times a day (BID) | ORAL | 0 refills | Status: AC

## 2023-02-13 NOTE — Progress Notes (Signed)
Rx for Trich sent in  Myna Hidalgo, DO Attending Obstetrician & Gynecologist, [facility] for [employer], [facility]

## 2023-02-17 ENCOUNTER — Other Ambulatory Visit: Payer: Medicaid Other

## 2023-02-19 ENCOUNTER — Telehealth: Payer: Self-pay | Admitting: *Deleted

## 2023-02-19 NOTE — Telephone Encounter (Signed)
Pt aware RPR and HIV was negative. Pt voiced understanding. JSY

## 2023-02-27 ENCOUNTER — Telehealth: Payer: Self-pay | Admitting: Obstetrics & Gynecology

## 2023-02-27 NOTE — Telephone Encounter (Signed)
Pt is requesting a call about her labs. Please advise

## 2023-02-27 NOTE — Telephone Encounter (Signed)
Returned patient's call.  Patient states she has not been able to log into her mychart until yesterday and has several questions regarding labs.  Patient states she has been started on several medications recently and is concerned that if she is pregnant she is harming the baby.  States she is feeling movement and thinks she might be.  Appt made for Monday for UPT and possible bloodwork.

## 2023-03-02 ENCOUNTER — Encounter: Payer: Self-pay | Admitting: Obstetrics & Gynecology

## 2023-03-02 ENCOUNTER — Ambulatory Visit (INDEPENDENT_AMBULATORY_CARE_PROVIDER_SITE_OTHER): Payer: Medicaid Other

## 2023-03-02 DIAGNOSIS — Z3202 Encounter for pregnancy test, result negative: Secondary | ICD-10-CM

## 2023-03-02 DIAGNOSIS — Z3200 Encounter for pregnancy test, result unknown: Secondary | ICD-10-CM

## 2023-03-02 LAB — POCT URINE PREGNANCY: Preg Test, Ur: NEGATIVE

## 2023-03-02 NOTE — Progress Notes (Signed)
   NURSE VISIT- PREGNANCY CONFIRMATION   SUBJECTIVE:  Rita Baldwin is a 37 y.o. [phone] female of No LMP recorded. Patient has had an ablation. States she feels movement in her lower abdomen and has gained weight. Recently went to [facility] on 11/30 and had an ultrasound which showed thickening of the endometrium and cystic areas.  UPT there was negative. Here for pregnancy confirmation.  Home pregnancy test: negative  She reports nausea but has had some GI issues recently so thinks it could be related to that.  She is not taking prenatal vitamins.    OBJECTIVE:  There were no vitals taken for this visit.  Appears well, in no apparent distress  Results for orders placed or performed in visit on 03/02/23 (from the past 24 hours)  POCT urine pregnancy   Collection Time: 03/02/23  9:49 AM  Result Value Ref Range   Preg Test, Ur Negative Negative    ASSESSMENT: Negative pregnancy test    PLAN: HCG today OB packet given: No   Jobe Marker  03/02/2023 10:34 AM

## 2023-03-03 ENCOUNTER — Encounter: Payer: Self-pay | Admitting: Obstetrics & Gynecology

## 2023-03-03 LAB — BETA HCG QUANT (REF LAB): hCG Quant: <1 m[IU]/mL

## 2023-03-31 ENCOUNTER — Ambulatory Visit (HOSPITAL_COMMUNITY)
Admission: RE | Admit: 2023-03-31 | Discharge: 2023-03-31 | Disposition: A | Discharge status: Home / Self Care | Payer: Medicaid Other | Source: Ambulatory Visit | Attending: Obstetrics & Gynecology

## 2023-03-31 ENCOUNTER — Other Ambulatory Visit: Payer: Self-pay | Admitting: Obstetrics & Gynecology

## 2023-03-31 ENCOUNTER — Encounter (HOSPITAL_COMMUNITY): Payer: Self-pay

## 2023-03-31 ENCOUNTER — Ambulatory Visit (HOSPITAL_COMMUNITY)
Admission: RE | Admit: 2023-03-31 | Discharge: 2023-03-31 | Disposition: A | Discharge status: Home / Self Care | Payer: Medicaid Other | Source: Ambulatory Visit | Attending: Obstetrics & Gynecology | Admitting: Obstetrics & Gynecology

## 2023-03-31 ENCOUNTER — Other Ambulatory Visit (INDEPENDENT_AMBULATORY_CARE_PROVIDER_SITE_OTHER): Payer: Medicaid Other

## 2023-03-31 ENCOUNTER — Other Ambulatory Visit (HOSPITAL_COMMUNITY): Payer: Self-pay | Admitting: Obstetrics & Gynecology

## 2023-03-31 DIAGNOSIS — R928 Other abnormal and inconclusive findings on diagnostic imaging of breast: Secondary | ICD-10-CM

## 2023-03-31 DIAGNOSIS — N63 Unspecified lump in unspecified breast: Secondary | ICD-10-CM | POA: Diagnosis present

## 2023-03-31 DIAGNOSIS — N6321 Unspecified lump in the left breast, upper outer quadrant: Secondary | ICD-10-CM | POA: Insufficient documentation

## 2023-03-31 DIAGNOSIS — Z8619 Personal history of other infectious and parasitic diseases: Secondary | ICD-10-CM

## 2023-03-31 DIAGNOSIS — Z09 Encounter for follow-up examination after completed treatment for conditions other than malignant neoplasm: Secondary | ICD-10-CM | POA: Diagnosis not present

## 2023-03-31 NOTE — Progress Notes (Signed)
   NURSE VISIT- POC  SUBJECTIVE:  Rita Baldwin is a 38 y.o. [id number] GYN patientfemale here for a vaginal swab for proof of cure after treatment for Trichomonas.  She reports the following symptoms: none Denies abnormal vaginal bleeding, significant pelvic pain, fever, or UTI symptoms.  OBJECTIVE:  There were no vitals taken for this visit.  Appears well, in no apparent distress  ASSESSMENT: Vaginal swab for proof of cure after treatment for trichomoniasis  PLAN: Self-collected vaginal probe for Trichomonas sent to lab Treatment: to be determined once results are received Follow-up as needed if symptoms persist/worsen, or new symptoms develop  Laveta Gilkey  03/31/2023 9:39 AM

## 2023-04-02 ENCOUNTER — Other Ambulatory Visit: Payer: Medicaid Other

## 2023-04-02 ENCOUNTER — Encounter: Payer: Self-pay | Admitting: Obstetrics & Gynecology

## 2023-04-02 ENCOUNTER — Ambulatory Visit (INDEPENDENT_AMBULATORY_CARE_PROVIDER_SITE_OTHER): Payer: Medicaid Other | Admitting: Obstetrics & Gynecology

## 2023-04-02 VITALS — BP 132/79 | HR 94 | Ht 63.0 in | Wt 116.8 lb

## 2023-04-02 DIAGNOSIS — Z9889 Other specified postprocedural states: Secondary | ICD-10-CM | POA: Diagnosis not present

## 2023-04-02 DIAGNOSIS — R102 Pelvic and perineal pain: Secondary | ICD-10-CM

## 2023-04-02 LAB — TRICHOMONAS VAGINALIS, PROBE AMP: Trich vag by NAA: NEGATIVE

## 2023-04-02 NOTE — Progress Notes (Signed)
GYN VISIT Patient name: Rita Baldwin MRN [id number]  Date of birth: 01-10-1986 Chief Complaint:   Follow-up  History of Present Illness:   Rita Baldwin is a 38 y.o. [id number]  female being seen today for the following concerns:  1) Recent abnormal mammogram.  Pt notes that she is scheduled for biopsy Jan 28  She presents today as continued follow up for pelvic pain.  At her last visit, she had been seen at [facility] where an [place] was completed.    I pulled up the recent US from [facility] and reviewed findings- while cystic areas were noted in endometrium- these findings are consistent with recent ablation.  Both ovaries were noted to be normal- no ovarian cysts seen.    Pt states that she was told she had multiple cysts then later said she had cyst on her ovary.  She notes a mass on her right side and is concerned that I'm not doing anything about it.    She says that after the ablation she almost bled out and had to come back to the hospital due to the heavy bleeding and low blood pressure.  She notes concern about bladder control.  She also notes concerns that there was a "rip on cervix" that I did not mention to her.  Says this was a mistake and asks why she was never told about these things.  Initially pt was undressed for the exam, but then got dressed as she seemed to be overwhelmed and/or annoyed with my responses and the crying child.  She at first stated that I refused to perform the exam and didn't understand why she was even her.  At some point, I decided to call in Faith Rogue as my chaperone as I was concerned about miscommunication.  In terms of her right sided mass- I stated that "I don't know" that it does not seem gynecologic in nature and has been seen by PCP and ruled out for hernia or other etiology.   Pt allowed me to do a brief abdominal exam where I palpated along her area of concern.  I did not feel a mass present nor do I see any abnormality.  There is some scar tissue  present along low transverse abdomen due to prior C-section, but within the range of normal.  Again, I stated to patient that she seemed unhappy with my care and I am not sure how I can be of assistance.  In terms of the "cervical rip" I discussed with patient that repair from a tenaculum placement was not something that I consider to be out of the ordinary.  She also seemed frustrated that on prior exams I had said in my notes no pelvic or abdominal pain, but reports that she was gripping the bed in pain due to the exam.  Pt seemed frustrated by my response and stated that today was a "waste of her time."  Today's visit was challenging as pt presented with her young child who was crying throughout her visit.  Despite mom's attempts to soothe infant child, baby would not be consoled.  We did our best during the visit to communicate despite the noise/distraction.   Past Medical History:  Diagnosis Date   Anxiety 03/17/2010   Arthritis    Bronchitis    Chlamydia 03/18/2007   COPD (chronic obstructive pulmonary disease) (HCC)    Drug abuse (HCC)    GERD (gastroesophageal reflux disease) age 58   Headache    Hypertension  03/17/2013   Overdose    "I overdosed on tylenol as a kid"   Palpitations    Panic attacks    Precancerous changes of the cervix as a child   PTSD (post-traumatic stress disorder)    Scoliosis fifth grade   UTI (lower urinary tract infection)    Vaginal Pap smear, abnormal    Reviewed problem list, medications and allergies. Physical Assessment:   Vitals:   04/02/23 1100  BP: 132/79  Pulse: 94  Weight: 116 lb 12.8 oz (53 kg)  Height: 5\' 3"  (1.6 m)  Body mass index is 20.69 kg/m.       Physical Examination:   General appearance: well-nourished, no acute distress.    Psych:  appeared frustrated, rapid speech  Skin: warm & dry   Respiratory: normal respiratory effort, no distress  Abdomen: on visualization no abnormalities noted, prior C_section scar well  healed.  No masses palpated.  No acute defects palpated  Pelvic: exam declined by the patient- did not want to be examined due to difficulty coping with child and staying on exam table  Extremities: no edema   Chaperone: Faith Rogue    Assessment & Plan:  1) Pelvic pain -as pt mentioned she has had some relief, specifically her dysmenorrhea -pt notes that she is still having pelvic pain -based on today's interaction and prior notes/ED visits I am concerned about Ms. Demers's mental well-being and its role regarding her ailments -while I cannot r/o endometriosis as a cause of her pelvic pain, based on her interaction with me I do not feel like we are capable of together trying conservative management prior to surgical intervention -it is unclear to me what Ms. Daigneault is searching for.  As I have stated before and again today, I have encouraged her to seek a 2nd opinion for further evaluation and management as she seems displeased with my care  -additionally after today's visit, I do not feel comfortable with Ms. Alferd Apa without a chaperone present  Myna Hidalgo, DO Attending Obstetrician & Gynecologist, Biochemist, clinical for [employer], [facility]

## 2023-04-09 ENCOUNTER — Other Ambulatory Visit: Payer: Medicaid Other

## 2023-04-09 ENCOUNTER — Other Ambulatory Visit: Payer: Self-pay | Admitting: Obstetrics & Gynecology

## 2023-04-09 DIAGNOSIS — R928 Other abnormal and inconclusive findings on diagnostic imaging of breast: Secondary | ICD-10-CM

## 2023-04-14 ENCOUNTER — Ambulatory Visit (HOSPITAL_COMMUNITY): Payer: Medicaid Other

## 2023-04-16 ENCOUNTER — Ambulatory Visit
Admission: RE | Admit: 2023-04-16 | Discharge: 2023-04-16 | Disposition: A | Discharge status: Home / Self Care | Payer: Medicaid Other | Source: Ambulatory Visit | Attending: Obstetrics & Gynecology | Admitting: Obstetrics & Gynecology

## 2023-04-16 ENCOUNTER — Other Ambulatory Visit: Payer: Medicaid Other

## 2023-04-16 DIAGNOSIS — R928 Other abnormal and inconclusive findings on diagnostic imaging of breast: Secondary | ICD-10-CM

## 2023-04-16 HISTORY — PX: BREAST BIOPSY: SHX20

## 2023-04-17 LAB — SURGICAL PATHOLOGY
